# Patient Record
Sex: Female | Born: 1960 | Race: Black or African American | Hispanic: No | Marital: Married | Smoking: Never smoker
Health system: Southern US, Community
[De-identification: ages and names within clinical notes are randomized; demographics above are authoritative.]

## PROBLEM LIST (undated history)

## (undated) DIAGNOSIS — E785 Hyperlipidemia, unspecified: Secondary | ICD-10-CM

## (undated) DIAGNOSIS — I10 Essential (primary) hypertension: Secondary | ICD-10-CM

## (undated) DIAGNOSIS — I639 Cerebral infarction, unspecified: Secondary | ICD-10-CM

## (undated) HISTORY — DX: Cerebral infarction, unspecified: I63.9

## (undated) HISTORY — DX: Essential (primary) hypertension: I10

## (undated) HISTORY — DX: Hyperlipidemia, unspecified: E78.5

---

## 2004-11-16 ENCOUNTER — Emergency Department (HOSPITAL_COMMUNITY): Admission: EM | Admit: 2004-11-16 | Discharge: 2004-11-16 | Payer: Self-pay | Admitting: Emergency Medicine

## 2014-04-17 ENCOUNTER — Ambulatory Visit (INDEPENDENT_AMBULATORY_CARE_PROVIDER_SITE_OTHER): Payer: BLUE CROSS/BLUE SHIELD

## 2014-04-17 ENCOUNTER — Ambulatory Visit (INDEPENDENT_AMBULATORY_CARE_PROVIDER_SITE_OTHER): Payer: BLUE CROSS/BLUE SHIELD | Admitting: Physician Assistant

## 2014-04-17 VITALS — BP 160/92 | HR 92 | Temp 98.2°F | Resp 18 | Ht 64.5 in | Wt 235.6 lb

## 2014-04-17 DIAGNOSIS — M79671 Pain in right foot: Secondary | ICD-10-CM

## 2014-04-17 MED ORDER — DOXYCYCLINE HYCLATE 100 MG PO CAPS: 100.0000 mg | ORAL_CAPSULE | Freq: Two times a day (BID) | ORAL | Status: AC

## 2014-04-17 NOTE — Progress Notes (Deleted)
   Subjective:    Patient ID: Terri RilesRosalind E Dickerson, female    DOB: [birth year], 54 y.o.   MRN: [id number]  HPI    Review of Systems     Objective:   Physical Exam        Assessment & Plan:

## 2014-04-17 NOTE — Patient Instructions (Signed)
Elevate the foot at night. Take the doxycycline to completion.

## 2014-04-18 NOTE — Progress Notes (Signed)
[facility] [address] [place] [address] [phone]  Date:  04/17/2014   Name:  Terri Dickerson   DOB:  04/06/1960   MRN:  [id number]  PCP:  No primary care provider on file.    Chief Complaint: Blister   History of Present Illness:  Terri Dickerson is a 54 y.o. very pleasant female patient who presents with the following:  Patient notes 3 days of foot pain at the sole of the foot.  Pain is aggravated by weight and pressure on that right foot.  She thought she had a blister due to being on her feet all day, and walking more with commuting.  Patient has no known hx of diabetes, or htn.  She denies chest pains or palpitations.  There is swelling along her right foot, which she notes is normally not that swollen.  She denies loss of sensation of her lower extremities.  She denies calf pain, long sedentary positioning, long travel, or fever.  She has no trauma to the right foot, and has never had a significant hx of foot nor ankle injury. Patient has no hx of gout or pain at this site in the past.   There are no active problems to display for this patient.   History reviewed. No pertinent past medical history.  History reviewed. No pertinent past surgical history.  History  Substance Use Topics  . Smoking status: Never Smoker   . Smokeless tobacco: Not on file  . Alcohol Use: Not on file    Family History  Problem Relation Age of Onset  . Hypertension Mother   . Hyperlipidemia Mother     No Known Allergies  Medication list has been reviewed and updated.  No current outpatient prescriptions on file prior to visit.   No current facility-administered medications on file prior to visit.    Review of Systems: ROS otherwise unremarkable unless listed above.   Physical Examination: Filed Vitals:   04/17/14 1140  BP: 160/92  Pulse: 92  Temp: 98.2 F (36.8 C)  Resp: 18   Filed Vitals:   04/17/14 1140  Height: 5' 4.5" (1.638 m)   Weight: 235 lb 9.6 oz (106.867 kg)   Body mass index is 39.83 kg/(m^2). Ideal Body Weight: Weight in (lb) to have BMI = 25: 147.6  Physical Exam  Constitutional: She is oriented to person, place, and time. She appears well-developed and well-nourished. No distress.  Eyes: EOM are normal. Pupils are equal, round, and reactive to light.  Neck: No thyromegaly present.  Cardiovascular: Normal rate, regular rhythm and normal heart sounds.  Exam reveals no friction rub.   No murmur heard. Pulmonary/Chest: Effort normal and breath sounds normal. No respiratory distress.  Musculoskeletal: Normal range of motion.  Neurological: She is alert and oriented to person, place, and time.  Skin: Skin is warm and dry. She is not diaphoretic.  Red along the 1st metatarsal of the right foot.  Minimal fluctuance appreciated.  Tender just distal to first metatarsal.  Normal ROM.  1st MTP with dorsiflexion incites pain.  Normal strength throughout.  There is non-pitting edema throughout the right foot and digits.  Normal vibratory sensation at the 1st MTP without pain.  No swelling around the ankle/malleolus.  No tenderness along the calf  Psychiatric: She has a normal mood and affect. Her behavior is normal.     [facility] reading (PRIMARY) by  Dr. Neva Seat: Negative   Assessment and Plan: 54 year old  female without any known illness, is here today for right foot pain.  Diff dx: cellulitis, stress fracture, gout.  More suspicious of bacterial etiology. Treating with doxycycline Metarsal padding placed to decrease weight off pain site. Advised NSAID use and elevation rtc in 3 day  Right foot pain - Plan: DG Foot Complete Right, doxycycline (VIBRAMYCIN) 100 MG capsule  Plan reviewed with Dr. Lorenza CambridgeGreene Waddell Iten, PA-C [facility] 3/31/20164:24 PM

## 2015-05-04 ENCOUNTER — Encounter (HOSPITAL_COMMUNITY): Payer: Self-pay | Admitting: Emergency Medicine

## 2015-05-04 ENCOUNTER — Emergency Department (HOSPITAL_COMMUNITY): Payer: Self-pay

## 2015-05-04 ENCOUNTER — Emergency Department (HOSPITAL_COMMUNITY)
Admission: EM | Admit: 2015-05-04 | Discharge: 2015-05-04 | Disposition: A | Discharge status: Home / Self Care | Payer: Self-pay | Attending: Emergency Medicine | Admitting: Emergency Medicine

## 2015-05-04 DIAGNOSIS — M79602 Pain in left arm: Secondary | ICD-10-CM | POA: Insufficient documentation

## 2015-05-04 DIAGNOSIS — I10 Essential (primary) hypertension: Secondary | ICD-10-CM | POA: Insufficient documentation

## 2015-05-04 LAB — CBC WITH DIFFERENTIAL/PLATELET
Basophils Absolute: 0 10*3/uL (ref 0.0–0.1)
Basophils Relative: 0 %
EOS ABS: 0.2 10*3/uL (ref 0.0–0.7)
EOS PCT: 3 %
HCT: 38.8 % (ref 36.0–46.0)
Hemoglobin: 12.4 g/dL (ref 12.0–15.0)
LYMPHS ABS: 2.8 10*3/uL (ref 0.7–4.0)
LYMPHS PCT: 47 %
MCH: 26.6 pg (ref 26.0–34.0)
MCHC: 32 g/dL (ref 30.0–36.0)
MCV: 83.1 fL (ref 78.0–100.0)
MONO ABS: 0.4 10*3/uL (ref 0.1–1.0)
MONOS PCT: 6 %
Neutro Abs: 2.7 10*3/uL (ref 1.7–7.7)
Neutrophils Relative %: 44 %
PLATELETS: 170 10*3/uL (ref 150–400)
RBC: 4.67 MIL/uL (ref 3.87–5.11)
RDW: 13.1 % (ref 11.5–15.5)
WBC: 6 10*3/uL (ref 4.0–10.5)

## 2015-05-04 LAB — URINE MICROSCOPIC-ADD ON

## 2015-05-04 LAB — I-STAT TROPONIN, ED: Troponin i, poc: 0.01 ng/mL (ref 0.00–0.08)

## 2015-05-04 LAB — URINALYSIS, ROUTINE W REFLEX MICROSCOPIC
BILIRUBIN URINE: NEGATIVE
GLUCOSE, UA: NEGATIVE mg/dL
Ketones, ur: NEGATIVE mg/dL
Leukocytes, UA: NEGATIVE
NITRITE: NEGATIVE
PH: 7.5 (ref 5.0–8.0)
Protein, ur: NEGATIVE mg/dL
SPECIFIC GRAVITY, URINE: 1.012 (ref 1.005–1.030)

## 2015-05-04 LAB — BASIC METABOLIC PANEL
Anion gap: 10 (ref 5–15)
BUN: 8 mg/dL (ref 6–20)
CO2: 28 mmol/L (ref 22–32)
CREATININE: 0.58 mg/dL (ref 0.44–1.00)
Calcium: 9.2 mg/dL (ref 8.9–10.3)
Chloride: 100 mmol/L — ABNORMAL LOW (ref 101–111)
GFR calc Af Amer: >60 mL/min (ref >60)
GLUCOSE: 98 mg/dL (ref 65–99)
POTASSIUM: 3.6 mmol/L (ref 3.5–5.1)
SODIUM: 138 mmol/L (ref 135–145)

## 2015-05-04 MED ORDER — HYDROCODONE-ACETAMINOPHEN 5-325 MG PO TABS: 1.0000 | ORAL_TABLET | Freq: Four times a day (QID) | ORAL | Status: DC | PRN

## 2015-05-04 MED ORDER — AMLODIPINE BESYLATE 10 MG PO TABS: 10.0000 mg | ORAL_TABLET | Freq: Every day | ORAL | Status: DC

## 2015-05-04 MED ORDER — HYDRALAZINE HCL 20 MG/ML IJ SOLN
10.0000 mg | Freq: Once | INTRAMUSCULAR | Status: AC
  Administered 2015-05-04: 10 mg via INTRAVENOUS
  Filled 2015-05-04: qty 1

## 2015-05-04 MED ORDER — AMLODIPINE BESYLATE 5 MG PO TABS
10.0000 mg | ORAL_TABLET | Freq: Once | ORAL | Status: AC
  Administered 2015-05-04: 10 mg via ORAL
  Filled 2015-05-04: qty 2

## 2015-05-04 NOTE — ED Notes (Signed)
Dr. Elesa MassedWard notified on pt.'s elevated blood pressure .

## 2015-05-04 NOTE — ED Notes (Signed)
Pt taken to xray 

## 2015-05-04 NOTE — ED Notes (Addendum)
Pt. injured her left arm after lifting a pt. at work this evening reports pain at left elbow and left wrist . Hypertensive at arrival.

## 2015-05-04 NOTE — Discharge Instructions (Signed)
To find a primary care or specialty doctor please call [phone] or [phone] to access "[place] Find a Doctor Service."  You may also go on the [facility] website at [email or web address]  There are also multiple [facility] practices throughout the [place] that are frequently accepting new patients. You may find a clinic that is close to your home and contact them.  [facility] -  [address] [place] [address] [phone]  [facility] in [place] (also locations in [place] and [place]) -  [address] [place] [address] [phone]  [facility] -  [address] [place] [address] [phone]      DASH Eating Plan DASH stands for "Dietary Approaches to Stop Hypertension." The DASH eating plan is a healthy eating plan that has been shown to reduce high blood pressure (hypertension). Additional health benefits may include reducing the risk of type 2 diabetes mellitus, heart disease, and stroke. The DASH eating plan may also help with weight loss. WHAT DO I NEED TO KNOW ABOUT THE DASH EATING PLAN? For the DASH eating plan, you will follow these general guidelines:  Choose foods with a percent daily value for sodium of less than 5% (as listed on the food label).  Use salt-free seasonings or herbs instead of table salt or sea salt.  Check with your health care provider or pharmacist before using salt substitutes.  Eat lower-sodium products, often labeled as "lower sodium" or "no salt added."  Eat fresh foods.  Eat more vegetables, fruits, and low-fat dairy products.  Choose whole grains. Look for the word "whole" as the first word in the ingredient list.  Choose fish and skinless chicken or [place] more often than red meat. Limit fish, poultry, and meat to 6 oz (170 g) each day.  Limit sweets, desserts, sugars, and sugary  drinks.  Choose heart-healthy fats.  Limit cheese to 1 oz (28 g) per day.  Eat more home-cooked food and less restaurant, buffet, and fast food.  Limit fried foods.  Cook foods using methods other than frying.  Limit canned vegetables. If you do use them, rinse them well to decrease the sodium.  When eating at a restaurant, ask that your food be prepared with less salt, or no salt if possible. WHAT FOODS CAN I EAT? Seek help from a dietitian for individual calorie needs. Grains Whole grain or whole wheat bread. Brown rice. Whole grain or whole wheat pasta. Quinoa, bulgur, and whole grain cereals. Low-sodium cereals. Corn or whole wheat flour tortillas. Whole grain cornbread. Whole grain crackers. Low-sodium crackers. Vegetables Fresh or frozen vegetables (raw, steamed, roasted, or grilled). Low-sodium or reduced-sodium tomato and vegetable juices. Low-sodium or reduced-sodium tomato sauce and paste. Low-sodium or reduced-sodium canned vegetables.  Fruits All fresh, canned (in natural juice), or frozen fruits. Meat and Other Protein Products Ground beef (85% or leaner), grass-fed beef, or beef trimmed of fat. Skinless chicken or [place]. Ground chicken or [place]. Pork trimmed of fat. All fish and seafood. Eggs. Dried beans, peas, or lentils. Unsalted nuts and seeds. Unsalted canned beans. Dairy Low-fat dairy products, such as skim or 1% milk, 2% or reduced-fat cheeses, low-fat ricotta or cottage cheese, or plain low-fat yogurt. Low-sodium or reduced-sodium cheeses. Fats and Oils Tub margarines without trans fats. Light or reduced-fat mayonnaise and salad dressings (reduced sodium). Avocado. Safflower, olive, or canola oils. Natural peanut or almond butter. Other Unsalted popcorn and pretzels. The  items listed above may not be a complete list of recommended foods or beverages. Contact your dietitian for more options. WHAT FOODS ARE NOT RECOMMENDED? Grains White bread. White pasta.  White rice. Refined cornbread. Bagels and croissants. Crackers that contain trans fat. Vegetables Creamed or fried vegetables. Vegetables in a cheese sauce. Regular canned vegetables. Regular canned tomato sauce and paste. Regular tomato and vegetable juices. Fruits Dried fruits. Canned fruit in light or heavy syrup. Fruit juice. Meat and Other Protein Products Fatty cuts of meat. Ribs, chicken wings, bacon, sausage, bologna, salami, chitterlings, fatback, hot dogs, bratwurst, and packaged luncheon meats. Salted nuts and seeds. Canned beans with salt. Dairy Whole or 2% milk, cream, half-and-half, and cream cheese. Whole-fat or sweetened yogurt. Full-fat cheeses or blue cheese. Nondairy creamers and whipped toppings. Processed cheese, cheese spreads, or cheese curds. Condiments Onion and garlic salt, seasoned salt, table salt, and sea salt. Canned and packaged gravies. Worcestershire sauce. Tartar sauce. Barbecue sauce. Teriyaki sauce. Soy sauce, including reduced sodium. Steak sauce. Fish sauce. Oyster sauce. Cocktail sauce. Horseradish. Ketchup and mustard. Meat flavorings and tenderizers. Bouillon cubes. Hot sauce. Tabasco sauce. Marinades. Taco seasonings. Relishes. Fats and Oils Butter, stick margarine, lard, shortening, ghee, and bacon fat. Coconut, palm kernel, or palm oils. Regular salad dressings. Other Pickles and olives. Salted popcorn and pretzels. The items listed above may not be a complete list of foods and beverages to avoid. Contact your dietitian for more information. WHERE CAN I FIND MORE INFORMATION? National Heart, Lung, and Blood Institute: CablePromo.itwww.nhlbi.nih.gov/health/health-topics/topics/dash/   This information is not intended to replace advice given to you by your health care provider. Make sure you discuss any questions you have with your health care provider.   Document Released: 12/24/2010 Document Revised: 01/25/2014 Document Reviewed: 11/08/2012 Elsevier Interactive  Patient Education 2016 [employer]  Hypertension Hypertension, commonly called high blood pressure, is when the force of blood pumping through your arteries is too strong. Your arteries are the blood vessels that carry blood from your heart throughout your body. A blood pressure reading consists of a higher number over a lower number, such as 110/72. The higher number (systolic) is the pressure inside your arteries when your heart pumps. The lower number (diastolic) is the pressure inside your arteries when your heart relaxes. Ideally you want your blood pressure below 120/80. Hypertension forces your heart to work harder to pump blood. Your arteries may become narrow or stiff. Having untreated or uncontrolled hypertension can cause heart attack, stroke, kidney disease, and other problems. RISK FACTORS Some risk factors for high blood pressure are controllable. Others are not.  Risk factors you cannot control include:   Race. You may be at higher risk if you are African American.  Age. Risk increases with age.  Gender. Men are at higher risk than women before age 53 years. After age 53, women are at higher risk than men. Risk factors you can control include:  Not getting enough exercise or physical activity.  Being overweight.  Getting too much fat, sugar, calories, or salt in your diet.  Drinking too much alcohol. SIGNS AND SYMPTOMS Hypertension does not usually cause signs or symptoms. Extremely high blood pressure (hypertensive crisis) may cause headache, anxiety, shortness of breath, and nosebleed. DIAGNOSIS To check if you have hypertension, your health care provider will measure your blood pressure while you are seated, with your arm held at the level of your heart. It should be measured at least twice using the same arm. Certain conditions can  cause a difference in blood pressure between your right and left arms. A blood pressure reading that is higher than normal on one occasion  does not mean that you need treatment. If it is not clear whether you have high blood pressure, you may be asked to return on a different day to have your blood pressure checked again. Or, you may be asked to monitor your blood pressure at home for 1 or more weeks. TREATMENT Treating high blood pressure includes making lifestyle changes and possibly taking medicine. Living a healthy lifestyle can help lower high blood pressure. You may need to change some of your habits. Lifestyle changes may include:  Following the DASH diet. This diet is high in fruits, vegetables, and whole grains. It is low in salt, red meat, and added sugars.  Keep your sodium intake below 2,300 mg per day.  Getting at least 30-45 minutes of aerobic exercise at least 4 times per week.  Losing weight if necessary.  Not smoking.  Limiting alcoholic beverages.  Learning ways to reduce stress. Your health care provider may prescribe medicine if lifestyle changes are not enough to get your blood pressure under control, and if one of the following is true:  You are 51-48 years of age and your systolic blood pressure is above 140.  You are 43 years of age or older, and your systolic blood pressure is above 150.  Your diastolic blood pressure is above 90.  You have diabetes, and your systolic blood pressure is over 140 or your diastolic blood pressure is over 90.  You have kidney disease and your blood pressure is above 140/90.  You have heart disease and your blood pressure is above 140/90. Your personal target blood pressure may vary depending on your medical conditions, your age, and other factors. HOME CARE INSTRUCTIONS  Have your blood pressure rechecked as directed by your health care provider.   Take medicines only as directed by your health care provider. Follow the directions carefully. Blood pressure medicines must be taken as prescribed. The medicine does not work as well when you skip doses. Skipping  doses also puts you at risk for problems.  Do not smoke.   Monitor your blood pressure at home as directed by your health care provider. SEEK MEDICAL CARE IF:   You think you are having a reaction to medicines taken.  You have recurrent headaches or feel dizzy.  You have swelling in your ankles.  You have trouble with your vision. SEEK IMMEDIATE MEDICAL CARE IF:  You develop a severe headache or confusion.  You have unusual weakness, numbness, or feel faint.  You have severe chest or abdominal pain.  You vomit repeatedly.  You have trouble breathing. MAKE SURE YOU:   Understand these instructions.  Will watch your condition.  Will get help right away if you are not doing well or get worse.   This information is not intended to replace advice given to you by your health care provider. Make sure you discuss any questions you have with your health care provider.   Document Released: 01/04/2005 Document Revised: 05/21/2014 Document Reviewed: 10/27/2012 Elsevier Interactive Patient Education 2016 [employer].  How to Take Your Blood Pressure HOW DO I GET A BLOOD PRESSURE MACHINE?  You can buy an electronic home blood pressure machine at your local pharmacy. Insurance will sometimes cover the cost if you have a prescription.  Ask your doctor what type of machine is best for you. There are different machines  for your arm and your wrist.  If you decide to buy a machine to check your blood pressure on your arm, first check the size of your arm so you can buy the right size cuff. To check the size of your arm:   Use a measuring tape that shows both inches and centimeters.   Wrap the measuring tape around the upper-middle part of your arm. You may need someone to help you measure.   Write down your arm measurement in both inches and centimeters.   To measure your blood pressure correctly, it is important to have the right size cuff.   If your arm is up to 13  inches (up to 34 centimeters), get an adult cuff size.  If your arm is 13 to 17 inches (35 to 44 centimeters), get a large adult cuff size.    If your arm is 17 to 20 inches (45 to 52 centimeters), get an adult thigh cuff.  WHAT DO THE NUMBERS MEAN?   There are two numbers that make up your blood pressure. For example: 120/80.  The first number (120 in our example) is called the "systolic pressure." It is a measure of the pressure in your blood vessels when your heart is pumping blood.  The second number (80 in our example) is called the "diastolic pressure." It is a measure of the pressure in your blood vessels when your heart is resting between beats.  Your doctor will tell you what your blood pressure should be. WHAT SHOULD I DO BEFORE I CHECK MY BLOOD PRESSURE?   Try to rest or relax for at least 30 minutes before you check your blood pressure.  Do not smoke.  Do not have any drinks with caffeine, such as:  Soda.  Coffee.  Tea.  Check your blood pressure in a quiet room.  Sit down and stretch out your arm on a table. Keep your arm at about the level of your heart. Let your arm relax.  Make sure that your legs are not crossed. HOW DO I CHECK MY BLOOD PRESSURE?  Follow the directions that came with your machine.  Make sure you remove any tight-fitting clothing from your arm or wrist. Wrap the cuff around your upper arm or wrist. You should be able to fit a finger between the cuff and your arm. If you cannot fit a finger between the cuff and your arm, it is too tight and should be removed and rewrapped.  Some units require you to manually pump up the arm cuff.  Automatic units inflate the cuff when you press a button.  Cuff deflation is automatic in both models.  After the cuff is inflated, the unit measures your blood pressure and pulse. The readings are shown on a monitor. Hold still and breathe normally while the cuff is inflated.  Getting a reading takes less  than a minute.  Some models store readings in a memory. Some provide a printout of readings. If your machine does not store your readings, keep a written record.  Take readings with you to your next visit with your doctor.   This information is not intended to replace advice given to you by your health care provider. Make sure you discuss any questions you have with your health care provider.   Document Released: 12/18/2007 Document Revised: 01/25/2014 Document Reviewed: 03/01/2013 Elsevier Interactive Patient Education [employer]

## 2015-05-04 NOTE — ED Provider Notes (Signed)
By signing my name below, I, Linus Galas, attest that this documentation has been prepared under the direction and in the presence of [employer], DO. Electronically Signed: Linus Galas, ED Scribe. 05/04/2015. 1:49 AM.  TIME SEEN: 1:41 AM  CHIEF COMPLAINT:  Chief Complaint  Patient presents with  . Arm Pain    HPI:  HPI Comments: Terri Dickerson is a 55 y.o. female who presents to the Emergency Department with no PMHx complaining of L upper arm pain that began At 8:30 PM. Pt is a CNA and picks up patients but denies any Injury that she is aware of. However, she states she has not been sleeping well lately due to working the 3rd shift. Pt is here because her arm continues to her and she was concerned that this could be cardiac in nature. Pt denies any chest pain or chest discomfort, SOB, N/V, diaphoresis or dizziness. She is very hypertensive. States she is not on blood pressure medication. Has not seen a primary care physician in many years. States she is not sure what her blood pressure normally runs. Denies headache, vision changes, numbness, tingling or weakness on one side of her body. No history of stress test or cardiac catheterization.  Pt is not on any medication. Pt does not see a PCP Pt does not smoke  ROS: See HPI Constitutional: no fever  Eyes: no drainage  ENT: no runny nose   Cardiovascular:  no chest pain  Resp: no SOB  GI: no vomiting GU: no dysuria Integumentary: no rash  Allergy: no hives  Musculoskeletal: no leg swelling  Neurological: no slurred speech ROS otherwise negative  PAST MEDICAL HISTORY/PAST SURGICAL HISTORY:  History reviewed. No pertinent past medical history.  MEDICATIONS:  Prior to Admission medications   Not on File    ALLERGIES:  No Known Allergies  SOCIAL HISTORY:  Social History  Substance Use Topics  . Smoking status: Never Smoker   . Smokeless tobacco: Not on file  . Alcohol Use: 0.0 oz/week    0 Standard drinks or  equivalent per week    FAMILY HISTORY: Family History  Problem Relation Age of Onset  . Hypertension Mother   . Hyperlipidemia Mother     EXAM: BP 201/111 mmHg  Pulse 98  Temp(Src) 97.9 F (36.6 C) (Oral)  Resp 18  Ht  (1.626 m)  Wt 239 lb 3 oz (108.495 kg)  BMI 41.04 kg/m2  SpO2 99%  LMP  (LMP Unknown)   CONSTITUTIONAL: Alert and oriented and responds appropriately to questions. Well-appearing; well-nourished HEAD: Normocephalic EYES: Conjunctivae clear, PERRL ENT: normal nose; no rhinorrhea; moist mucous membranes NECK: Supple, no meningismus, no LAD  CARD: RRR; S1 and S2 appreciated; no murmurs, no clicks, no rubs, no gallops RESP: Normal chest excursion without splinting or tachypnea; breath sounds clear and equal bilaterally; no wheezes, no rhonchi, no rales, no hypoxia or respiratory distress, speaking full sentences ABD/GI: Normal bowel sounds; non-distended; soft, non-tender, no rebound, no guarding, no peritoneal signs BACK:  The back appears normal and is non-tender to palpation, there is no CVA tenderness EXT: Normal ROM in all joints; non-tender to palpation; no edema; normal capillary refill; no cyanosis, no calf tenderness or swelling , left arm is nontender to palpation, no bony deformity, no erythema, no warmth, soft compartments, no joint effusion, 2+ left-sided radial pulse, normal grip strength   SKIN: Normal color for age and race; warm; no rash NEURO: Moves all extremities equally, sensation to light touch  intact diffusely, cranial nerves II through XII intact PSYCH: The patient's mood and manner are appropriate. Grooming and personal hygiene are appropriate.  MEDICAL DECISION MAKING: Patient here with left-sided arm pain. She denies any known injury. There is no bony deformity or tenderness to palpation of the arm. No sign of septic arthritis, gout. Neurovascularly intact distally. She is extremely hypertensive in the emergency department. I'm concerned  that this could be her anginal equivalent. EKG shows no ischemic abnormality and she has no old for comparison. We'll give IV hydralazine to see if this improves her blood pressure in her left arm pain. We'll obtain labs, chest x-ray. At this time she does not want admission to the hospital.  ED PROGRESS: Patient's blood pressure has improved. Her pain is also almost completely gone. Troponin obtained 6 hours after the onset of symptoms is negative. Her urine shows trace blood but no protein. Normal creatinine. Chest x-ray is clear. I have discussed with her at length that I recommend close outpatient follow-up for her uncontrolled blood pressure. I do feel she is safe to be discharged and she refuses any further workup in the emergency department and would not want admission. We'll discharge with PCP follow-up information and start her on amlodipine 10 mg once a day. Have discussed at length return precautions. She verbalized understanding and is comfortable with this plan.   At this time, I do not feel there is any life-threatening condition present. I have reviewed and discussed all results (EKG, imaging, lab, urine as appropriate), exam findings with patient. I have reviewed nursing notes and appropriate previous records.  I feel the patient is safe to be discharged home without further emergent workup. Discussed usual and customary return precautions. Patient and family (if present) verbalize understanding and are comfortable with this plan.  Patient will follow-up with their primary care provider. If they do not have a primary care provider, information for follow-up has been provided to them. All questions have been answered.      EKG Interpretation  Date/Time:  Sunday May 04 2015 00:33:09 EDT Ventricular Rate:  78 PR Interval:  158 QRS Duration: 76 QT Interval:  388 QTC Calculation: 442 R Axis:   8 Text Interpretation:  Normal sinus rhythm Normal ECG No old tracing to compare Confirmed  by Aviel Davalos,  DO, Jesusita Jocelyn ([id number]) on 05/04/2015 1:23:27 AM       I personally performed the services described in this documentation, which was scribed in my presence. The recorded information has been reviewed and is accurate.    Layla MawKristen N Lakeith Careaga, DO 05/04/15 [phone]

## 2016-05-13 ENCOUNTER — Encounter: Payer: Self-pay | Admitting: Family Medicine

## 2016-05-13 ENCOUNTER — Ambulatory Visit (INDEPENDENT_AMBULATORY_CARE_PROVIDER_SITE_OTHER): Payer: Managed Care, Other (non HMO) | Admitting: Family Medicine

## 2016-05-13 VITALS — BP 184/92 | HR 80 | Temp 98.2°F | Resp 18 | Ht 63.58 in | Wt 216.6 lb

## 2016-05-13 DIAGNOSIS — Z5181 Encounter for therapeutic drug level monitoring: Secondary | ICD-10-CM | POA: Diagnosis not present

## 2016-05-13 DIAGNOSIS — R739 Hyperglycemia, unspecified: Secondary | ICD-10-CM | POA: Diagnosis not present

## 2016-05-13 DIAGNOSIS — I10 Essential (primary) hypertension: Secondary | ICD-10-CM | POA: Diagnosis not present

## 2016-05-13 DIAGNOSIS — R0681 Apnea, not elsewhere classified: Secondary | ICD-10-CM

## 2016-05-13 DIAGNOSIS — I638 Other cerebral infarction: Secondary | ICD-10-CM

## 2016-05-13 DIAGNOSIS — I6389 Other cerebral infarction: Secondary | ICD-10-CM

## 2016-05-13 MED ORDER — HYDROCHLOROTHIAZIDE 25 MG PO TABS: 25.0000 mg | ORAL_TABLET | Freq: Every day | ORAL | 0 refills | Status: DC

## 2016-05-13 MED ORDER — LISINOPRIL 10 MG PO TABS: 20.0000 mg | ORAL_TABLET | Freq: Every day | ORAL | Status: DC

## 2016-05-13 NOTE — Patient Instructions (Addendum)
IF you received an x-ray today, you will receive an invoice from [facility]. Please contact [facility] at [phone] with questions or concerns regarding your invoice.   IF you received labwork today, you will receive an invoice from [place]. Please contact [facility] at [phone] with questions or concerns regarding your invoice.   Our billing staff will not be able to assist you with questions regarding bills from these companies.  You will be contacted with the lab results as soon as they are available. The fastest way to get your results is to activate your My Chart account. Instructions are located on the last page of this paperwork. If you have not heard from [place] regarding the results in 2 weeks, please contact this office.     ]Managing Your Hypertension Hypertension is commonly called high blood pressure. This is when the force of your blood pressing against the walls of your arteries is too strong. Arteries are blood vessels that carry blood from your heart throughout your body. Hypertension forces the heart to work harder to pump blood, and may cause the arteries to become narrow or stiff. Having untreated or uncontrolled hypertension can cause heart attack, stroke, kidney disease, and other problems. What are blood pressure readings? A blood pressure reading consists of a higher number over a lower number. Ideally, your blood pressure should be below 120/80. The first ("top") number is called the systolic pressure. It is a measure of the pressure in your arteries as your heart beats. The second ("bottom") number is called the diastolic pressure. It is a measure of the pressure in your arteries as the heart relaxes. What does my blood pressure reading mean? Blood pressure is classified into four stages. Based on your blood pressure reading, your health care provider may use the following stages to determine what type of treatment you need, if any. Systolic  pressure and diastolic pressure are measured in a unit called mm Hg. Normal   Systolic pressure: below 120.  Diastolic pressure: below 80. Elevated   Systolic pressure: 120-129.  Diastolic pressure: below 80. Hypertension stage 1     Diastolic pressure: 80-89. Hypertension stage 2   Systolic pressure: 140 or above.  Diastolic pressure: 90 or above. What health risks are associated with hypertension? Managing your hypertension is an important responsibility. Uncontrolled hypertension can lead to:  A heart attack.  A stroke.  A weakened blood vessel (aneurysm).  Heart failure.  Kidney damage.  Eye damage.  Metabolic syndrome.  Memory and concentration problems. What changes can I make to manage my hypertension? Eating and drinking   Eat a diet that is high in fiber and potassium, and low in salt (sodium), added sugar, and fat. An example eating plan is called the DASH (Dietary Approaches to Stop Hypertension) diet. To eat this way:  Eat plenty of fresh fruits and vegetables. Try to fill half of your plate at each meal with fruits and vegetables.  Eat whole grains, such as whole wheat pasta, brown rice, or whole grain bread. Fill about one quarter of your plate with whole grains.  Eat low-fat diary products.  Avoid fatty cuts of meat, processed or cured meats, and poultry with skin. Fill about one quarter of your plate with lean proteins such as fish, chicken without skin, beans, eggs, and tofu.  Avoid premade and processed foods. These tend to be higher in sodium, added sugar, and fat.     Lifestyle   Work with your health care  provider to maintain a healthy body weight, or to lose weight. Ask what an ideal weight is for you.  Get at least 30 minutes of exercise that causes your heart to beat faster (aerobic exercise) most days of the week. Activities may include walking, swimming, or biking.       Monitoring   Monitor your blood pressure at home as  told by your health care provider. Your personal target blood pressure may vary depending on your medical conditions, your age, and other factors.  Have your blood pressure checked regularly, as often as told by your health care provider. Working with your health care provider   Review all the medicines you take with your health care provider because there may be side effects or interactions.  Talk with your health care provider about your diet, exercise habits, and other lifestyle factors that may be contributing to hypertension.  Visit your health care provider regularly. Your health care provider can help you create and adjust your plan for managing hypertension. Will I need medicine to control my blood pressure? Your health care provider may prescribe medicine if lifestyle changes are not enough to get your blood pressure under control, and if:  Your systolic blood pressure is 130 or higher.  Your diastolic blood pressure is 80 or higher. Take medicines only as told by your health care provider. Follow the directions carefully. Blood pressure medicines must be taken as prescribed. The medicine does not work as well when you skip doses. Skipping doses also puts you at risk for problems. Contact a health care provider if:  You think you are having a reaction to medicines you have taken.  You have repeated (recurrent) headaches.  You feel dizzy.  You have swelling in your ankles.  You have trouble with your vision. Get help right away if:  You develop a severe headache or confusion.  You have unusual weakness or numbness, or you feel faint.  You have severe pain in your chest or abdomen.  You vomit repeatedly.  You have trouble breathing. Summary  Hypertension is when the force of blood pumping through your arteries is too strong. If this condition is not controlled, it may put you at risk for serious complications.  Your personal target blood pressure may vary depending  on your medical conditions, your age, and other factors. For most people, a normal blood pressure is less than 120/80.  Hypertension is managed by lifestyle changes, medicines, or both. Lifestyle changes include weight loss, eating a healthy, low-sodium diet, exercising more, and limiting alcohol. This information is not intended to replace advice given to you by your health care provider. Make sure you discuss any questions you have with your health care provider. Document Released: 09/29/2011 Document Revised: 12/03/2015 Document Reviewed: 12/03/2015 Elsevier Interactive Patient Education  2017 Else[employer] Inc.  Potassium Content of Foods Potassium is a mineral found in many foods and drinks. It helps keep fluids and minerals balanced in your body and affects how steadily your heart beats. Potassium also helps control your blood pressure and keep your muscles and nervous system healthy. Certain health conditions and medicines may change the balance of potassium in your body. When this happens, you can help balance your level of potassium through the foods that you do or do not eat. Your health care provider or dietitian may recommend an amount of potassium that you should have each day. The following lists of foods provide the amount of potassium (in parentheses) per serving in each  item. High in potassium The following foods and beverages have 200 mg or more of potassium per serving:  Apricots, 2 raw or 5 dry (200 mg).  Artichoke, 1 medium (345 mg).  Avocado, raw,  each (245 mg).  Banana, 1 medium (425 mg).  Beans, lima, or baked beans, canned,  cup (280 mg).  Beans, white, canned,  cup (595 mg).  Beef roast, 3 oz (320 mg).  Beef, ground, 3 oz (270 mg).  Beets, raw or cooked,  cup (260 mg).  Bran muffin, 2 oz (300 mg).  Broccoli,  cup (230 mg).  Brussels sprouts,  cup (250 mg).  Cantaloupe,  cup (215 mg).  Cereal, 100% bran,  cup (200-400 mg).  Cheeseburger, single,  fast food, 1 each (225-400 mg).  Chicken, 3 oz (220 mg).  Clams, canned, 3 oz (535 mg).  Crab, 3 oz (225 mg).  Dates, 5 each (270 mg).  Dried beans and peas,  cup (300-475 mg).  Figs, dried, 2 each (260 mg).  Fish: halibut, tuna, cod, snapper, 3 oz (480 mg).  Fish: salmon, haddock, swordfish, perch, 3 oz (300 mg).  Fish, tuna, canned 3 oz (200 mg).  [place] fries, fast food, 3 oz (470 mg).  Granola with fruit and nuts,  cup (200 mg).  Grapefruit juice,  cup (200 mg).  Greens, beet,  cup (655 mg).  Honeydew melon,  cup (200 mg).  Kale, raw, 1 cup (300 mg).  Kiwi, 1 medium (240 mg).  Kohlrabi, rutabaga, parsnips,  cup (280 mg).  Lentils,  cup (365 mg).  Mango, 1 each (325 mg).  Milk, chocolate, 1 cup (420 mg).  Milk: nonfat, low-fat, whole, buttermilk, 1 cup (350-380 mg).  Molasses, 1 Tbsp (295 mg).  Mushrooms,  cup (280) mg.  Nectarine, 1 each (275 mg).  Nuts: almonds, peanuts, hazelnuts, [place], cashew, mixed, 1 oz (200 mg).  Nuts, pistachios, 1 oz (295 mg).  Orange, 1 each (240 mg).  Orange juice,  cup (235 mg).  Papaya, medium,  fruit (390 mg).  Peanut butter, chunky, 2 Tbsp (240 mg).  Peanut butter, smooth, 2 Tbsp (210 mg).  Pear, 1 medium (200 mg).  Pomegranate, 1 whole (400 mg).  Pomegranate juice,  cup (215 mg).  Pork, 3 oz (350 mg).  Potato chips, salted, 1 oz (465 mg).  Potato, baked with skin, 1 medium (925 mg).  Potatoes, boiled,  cup (255 mg).  Potatoes, mashed,  cup (330 mg).  Prune juice,  cup (370 mg).  Prunes, 5 each (305 mg).  Pudding, chocolate,  cup (230 mg).  Pumpkin, canned,  cup (250 mg).  Raisins, seedless,  cup (270 mg).  Seeds, sunflower or pumpkin, 1 oz (240 mg).  Soy milk, 1 cup (300 mg).  Spinach,  cup (420 mg).  Spinach, canned,  cup (370 mg).  Sweet potato, baked with skin, 1 medium (450 mg).  Swiss chard,  cup (480 mg).  Tomato or vegetable juice,  cup (275  mg).  Tomato sauce or puree,  cup (400-550 mg).  Tomato, raw, 1 medium (290 mg).  Tomatoes, canned,  cup (200-300 mg).  [place], 3 oz (250 mg).  Wheat germ, 1 oz (250 mg).  Winter squash,  cup (250 mg).  Yogurt, plain or fruited, 6 oz (260-435 mg).  Zucchini,  cup (220 mg). Moderate in potassium The following foods and beverages have 50-200 mg of potassium per serving:  Apple, 1 each (150 mg).  Apple juice,  cup (150 mg).  Applesauce,  cup (  90 mg).  Apricot nectar,  cup (140 mg).  Asparagus, small spears,  cup or 6 spears (155 mg).  Bagel, cinnamon raisin, 1 each (130 mg).  Bagel, egg or plain, 4 in., 1 each (70 mg).  Beans, green,  cup (90 mg).  Beans, yellow,  cup (190 mg).  Beer, regular, 12 oz (100 mg).  Beets, canned,  cup (125 mg).  Blackberries,  cup (115 mg).  Blueberries,  cup (60 mg).  Bread, whole wheat, 1 slice (70 mg).  Broccoli, raw,  cup (145 mg).  Cabbage,  cup (150 mg).  Carrots, cooked or raw,  cup (180 mg).  Cauliflower, raw,  cup (150 mg).  Celery, raw,  cup (155 mg).  Cereal, bran flakes, cup (120-150 mg).  Cheese, cottage,  cup (110 mg).  Cherries, 10 each (150 mg).  Chocolate, 1 oz bar (165 mg).  Coffee, brewed 6 oz (90 mg).  Corn,  cup or 1 ear (195 mg).  Cucumbers,  cup (80 mg).  Egg, large, 1 each (60 mg).  Eggplant,  cup (60 mg).  Endive, raw, cup (80 mg).  English muffin, 1 each (65 mg).  Fish, orange roughy, 3 oz (150 mg).  Frankfurter, beef or pork, 1 each (75 mg).  Fruit cocktail,  cup (115 mg).  Grape juice,  cup (170 mg).  Grapefruit,  fruit (175 mg).  Grapes,  cup (155 mg).  Greens: kale, turnip, collard,  cup (110-150 mg).  Ice cream or frozen yogurt, chocolate,  cup (175 mg).  Ice cream or frozen yogurt, vanilla,  cup (120-150 mg).  Lemons, limes, 1 each (80 mg).  Lettuce, all types, 1 cup (100 mg).  Mixed vegetables,  cup (150 mg).  Mushrooms,  raw,  cup (110 mg).  Nuts: walnuts, pecans, or macadamia, 1 oz (125 mg).  Oatmeal,  cup (80 mg).  Okra,  cup (110 mg).  Onions, raw,  cup (120 mg).  Peach, 1 each (185 mg).  Peaches, canned,  cup (120 mg).  Pears, canned,  cup (120 mg).  Peas, green, frozen,  cup (90 mg).  Peppers, green,  cup (130 mg).  Peppers, red,  cup (160 mg).  Pineapple juice,  cup (165 mg).  Pineapple, fresh or canned,  cup (100 mg).  Plums, 1 each (105 mg).  Pudding, vanilla,  cup (150 mg).  Raspberries,  cup (90 mg).  Rhubarb,  cup (115 mg).  Rice, wild,  cup (80 mg).  Shrimp, 3 oz (155 mg).  Spinach, raw, 1 cup (170 mg).  Strawberries,  cup (125 mg).  Summer squash  cup (175-200 mg).  Swiss chard, raw, 1 cup (135 mg).  Tangerines, 1 each (140 mg).  Tea, brewed, 6 oz (65 mg).  Turnips,  cup (140 mg).  Watermelon,  cup (85 mg).  Wine, red, table, 5 oz (180 mg).  Wine, white, table, 5 oz (100 mg). Low in potassium The following foods and beverages have less than 50 mg of potassium per serving.  Bread, white, 1 slice (30 mg).  Carbonated beverages, 12 oz (less than 5 mg).  Cheese, 1 oz (20-30 mg).  Cranberries,  cup (45 mg).  Cranberry juice cocktail,  cup (20 mg).  Fats and oils, 1 Tbsp (less than 5 mg).  Hummus, 1 Tbsp (32 mg).  Nectar: papaya, mango, or pear,  cup (35 mg).  Rice, white or brown,  cup (50 mg).  Spaghetti or macaroni,  cup cooked (30 mg).  Tortilla, flour or corn, 1 each (  50 mg).  Waffle, 4 in., 1 each (50 mg).  Water chestnuts,  cup (40 mg). This information is not intended to replace advice given to you by your health care provider. Make sure you discuss any questions you have with your health care provider. Document Released: 08/18/2004 Document Revised: 06/12/2015 Document Reviewed: 12/01/2012 Elsevier Interactive Patient Education  2017 [employer].

## 2016-05-13 NOTE — Progress Notes (Signed)
Subjective:    Patient ID: Terri Dickerson, female    DOB: [birth year], 56 y.o.   MRN: [id number]  HPI  Terri Dickerson is a 56 yo who is here today to establish care. She requested a full physical However we have had to defer this as she is a new patient to me and my clinic and she reports having a stroke last week which we have no records. We do see that she has an appointment for OT and PT early next week  She had a CVA in [place] when she was there for a funeral 9d prior.  She is here to establish care todaty BP initially was similar to today 184/88 and at discharge was improved at 142/90  Mainly about 140s-150s with occ spike to 170s in the evening and 120s his morning. Pulse stays about 60-80 All of her medicines are new, she knows she should have been on medications but did not have ins so was not.  Is not fasting.  Occ nausea and dizziness but improving.   CVA presented with diaphroesis, and dizziness (not vertigo).  We have no records other than her personal d/c summary which has no labs nor indication of evaluation.  CT and MRI - pt thinks of  They tried to have her drink contrast for nausea initially w/u (attempted to do CT abd/pelvis to w/u)  She had an echocardiogram.  She is unsure about her carotids. She was on telemetry but no stress test.   In hosp Tues - Sat, home Sun.  No edema. Wearing compression socks. Review of Systems     Objective:   Physical Exam       BP (!) 184/92   Pulse 80   Temp 98.2 F (36.8 C) (Oral)   Resp 18   Ht 5' 3.58" (1.615 m)   Wt 216 lb 9.6 oz (98.2 kg)   LMP  (LMP Unknown)   SpO2 95%   BMI 37.67 kg/m   [facility] reading (PRIMARY) by  Dr. Clelia Croft. EKG:  Assessment & Plan:   1. Cerebrovascular accident (CVA) due to other mechanism (HCC)   2. Essential hypertension   3. Elevated blood sugar   4. Medication monitoring encounter   5. Malignant hypertension   6. Apnea spell     Orders Placed This Encounter  Procedures  .  Hemoglobin A1c  . Comprehensive metabolic panel  . Ambulatory referral to Neurology    Referral Priority:   Routine    Referral Type:   Consultation    Referral Reason:   Specialty Services Required    Requested Specialty:   Neurology    Number of Visits Requested:   1  . Ambulatory referral to Sleep Studies    Referral Priority:   Routine    Referral Type:   Consultation    Referral Reason:   Specialty Services Required    Number of Visits Requested:   1  . EKG 12-Lead    Meds ordered this encounter  Medications  . aspirin 81 MG chewable tablet    Sig: Chew by mouth daily.  . carvedilol (COREG) 12.5 MG tablet    Sig: Take 12.5 mg by mouth 2 (two) times daily with a meal.  . DISCONTD: lisinopril (PRINIVIL,ZESTRIL) 10 MG tablet    Sig: Take 10 mg by mouth daily.  Marland Kitchen atorvastatin (LIPITOR) 80 MG tablet    Sig: Take 80 mg by mouth daily.  . ondansetron (ZOFRAN-ODT) 4 MG disintegrating tablet  Sig: Take 4 mg by mouth every 8 (eight) hours as needed for nausea or vomiting.  Marland Kitchen lisinopril (PRINIVIL,ZESTRIL) 10 MG tablet    Sig: Take 2 tablets (20 mg total) by mouth daily.  . hydrochlorothiazide (HYDRODIURIL) 25 MG tablet    Sig: Take 1 tablet (25 mg total) by mouth daily.    Dispense:  30 tablet    Refill:  0  . potassium chloride SA (K-DUR,KLOR-CON) 20 MEQ tablet    Sig: Take 1 tablet (20 mEq total) by mouth daily.    Dispense:  7 tablet    Refill:  3    Terri Dickerson, M.D.  Primary Care at [facility] [address] [place], [place] [address] [phone] phone [phone] fax  05/14/16 11:30 AM

## 2016-05-14 LAB — COMPREHENSIVE METABOLIC PANEL
A/G RATIO: 1 — AB (ref 1.2–2.2)
ALBUMIN: 3.9 g/dL (ref 3.5–5.5)
ALT: 16 IU/L (ref 0–32)
AST: 19 IU/L (ref 0–40)
Alkaline Phosphatase: 92 IU/L (ref 39–117)
BUN / CREAT RATIO: 20 (ref 9–23)
BUN: 12 mg/dL (ref 6–24)
Bilirubin Total: 0.5 mg/dL (ref 0.0–1.2)
CO2: 31 mmol/L — ABNORMAL HIGH (ref 18–29)
CREATININE: 0.61 mg/dL (ref 0.57–1.00)
Calcium: 9.7 mg/dL (ref 8.7–10.2)
Chloride: 93 mmol/L — ABNORMAL LOW (ref 96–106)
GFR calc Af Amer: 120 mL/min/{1.73_m2} (ref >59)
GFR, EST NON AFRICAN AMERICAN: 104 mL/min/{1.73_m2} (ref >59)
GLOBULIN, TOTAL: 4 g/dL (ref 1.5–4.5)
Glucose: 115 mg/dL — ABNORMAL HIGH (ref 65–99)
POTASSIUM: 3.2 mmol/L — AB (ref 3.5–5.2)
SODIUM: 141 mmol/L (ref 134–144)
Total Protein: 7.9 g/dL (ref 6.0–8.5)

## 2016-05-14 LAB — HEMOGLOBIN A1C
Est. average glucose Bld gHb Est-mCnc: 120 mg/dL
HEMOGLOBIN A1C: 5.8 % — AB (ref 4.8–5.6)

## 2016-05-14 MED ORDER — POTASSIUM CHLORIDE CRYS ER 20 MEQ PO TBCR: 20.0000 meq | EXTENDED_RELEASE_TABLET | Freq: Every day | ORAL | 3 refills | Status: DC

## 2016-05-17 ENCOUNTER — Ambulatory Visit: Payer: 59 | Attending: Psychiatry | Admitting: Rehabilitative and Restorative Service Providers"

## 2016-05-17 ENCOUNTER — Encounter: Payer: Self-pay | Admitting: Occupational Therapy

## 2016-05-17 ENCOUNTER — Telehealth: Payer: Self-pay | Admitting: Family Medicine

## 2016-05-17 ENCOUNTER — Ambulatory Visit: Payer: 59 | Admitting: Occupational Therapy

## 2016-05-17 DIAGNOSIS — R278 Other lack of coordination: Secondary | ICD-10-CM | POA: Diagnosis present

## 2016-05-17 DIAGNOSIS — R270 Ataxia, unspecified: Secondary | ICD-10-CM

## 2016-05-17 DIAGNOSIS — R2689 Other abnormalities of gait and mobility: Secondary | ICD-10-CM | POA: Diagnosis present

## 2016-05-17 DIAGNOSIS — R2681 Unsteadiness on feet: Secondary | ICD-10-CM | POA: Insufficient documentation

## 2016-05-17 DIAGNOSIS — R41842 Visuospatial deficit: Secondary | ICD-10-CM | POA: Diagnosis present

## 2016-05-17 NOTE — Therapy (Signed)
[facility] [address] [place], [place], [address] Phone: [phone]   Fax:  [phone]  Physical Therapy Evaluation  Patient Details  Name: Terri Dickerson MRN: [id number] Date of Birth: 1960-02-07 Referring Provider: [name], MD  Encounter Date: 05/17/2016      PT End of Session - 05/17/16 1502    Visit Number 1   Number of Visits 12   Date for PT Re-Evaluation 07/16/16   Authorization Type private insurance   PT Start Time [phone]   PT Stop Time 1017   PT Time Calculation (min) 40 min   Equipment Utilized During Treatment Gait belt   Activity Tolerance Patient tolerated treatment well   Behavior During Therapy [facility] for tasks assessed/performed      Past Medical History:  Diagnosis Date  . Hyperlipidemia   . Hypertension   . Stroke [facility])     No past surgical history on file.  There were no vitals filed for this visit.       Subjective Assessment - 05/17/16 0942    Subjective The patient is s/p ischemic stroke on 05/04/16 with acute hospital stay at [facility] ( until SAturday 05/08/16).  She returned to [place].  She c/o sensation of nausea, having some dizziness (not room moving, but in her head), imbalance, notes her L side is a little weak.     Pertinent History BP 121/70 this morning   Patient Stated Goals Get back on center and get close to 100% if I can.  "Get back to work", "Get back to me".   Currently in Pain? No/denies  occasional headaches            [facility] PT Assessment - 05/17/16 0944      Assessment   Medical Diagnosis acute ischemic stroke   Referring Provider [name], MD   Onset Date/Surgical Date 05/04/16   Hand Dominance Right   Prior Therapy acute at [facility]     Precautions   Precautions Fall     Restrictions   Weight Bearing Restrictions No     Home Environment   Living Environment Private residence   Living Arrangements Parent   Type of Home House  duplex   Home  Access Stairs to enter   Entrance Stairs-Number of Steps 6-7   Entrance Stairs-Rails Cannot reach both   Home Layout One level   Home Equipment Walker - 2 wheels;Shower seat;Bedside commode  elevated commode seat     Prior Function   Level of Independence Independent   Vocation Full time employment   Insurance account manager- at [employer]   Leisure takes care of grandkids     Cognition   Overall Cognitive Status Within Functional Limits for tasks assessed     Observation/Other Assessments   Focus on Therapeutic Outcomes (FOTO)  50%   Other Surveys  Other Surveys   Neuro Quality of Life  34.3%     Sensation   Light Touch Appears Intact     Posture/Postural Control   Posture/Postural Control No significant limitations     ROM / Strength   AROM / PROM / Strength AROM;Strength     AROM   Overall AROM  Within functional limits for tasks performed     Strength   Overall Strength Within functional limits for tasks performed     Bed Mobility   Bed Mobility Sit to Supine;Supine to Sit   Supine to Sit 7: Independent   Sit to Supine 7: Independent  Transfers   Transfers Sit to Stand   Sit to Stand 6: Modified independent (Device/Increase time)   Comments to RW     Ambulation/Gait   Ambulation/Gait Yes   Ambulation/Gait Assistance 6: Modified independent (Device/Increase time)   Ambulation Distance (Feet) 200 Feet   Ambulation Surface Level;Indoor   Gait velocity 2.05 ft/sec with RW, 1.73 ft/sec without device    Stairs Yes   Stairs Assistance 4: Min assist   Stair Management Technique One rail Right;Step to pattern;Alternating pattern   Number of Stairs 4     Standardized Balance Assessment   Standardized Balance Assessment Berg Balance Test     Berg Balance Test   Sit to Stand Able to stand without using hands and stabilize independently   Standing Unsupported Able to stand safely 2 minutes   Sitting with Back Unsupported but Feet Supported on Floor or Stool Able to  sit safely and securely 2 minutes   Stand to Sit Controls descent by using hands   Transfers Able to transfer safely, definite need of hands   Standing Unsupported with Eyes Closed Able to stand 3 seconds   Standing Ubsupported with Feet Together Able to place feet together independently but unable to hold for 30 seconds   From Standing, Reach Forward with Outstretched Arm Can reach forward >12 cm safely (5")   From Standing Position, Pick up Object from Floor Able to pick up shoe, needs supervision   From Standing Position, Turn to Look Behind Over each Shoulder Turn sideways only but maintains balance   Turn 360 Degrees Needs close supervision or verbal cueing   Standing Unsupported, Alternately Place Feet on Step/Stool Able to complete >2 steps/needs minimal assist   Standing Unsupported, One Foot in Front Able to take small step independently and hold 30 seconds   Standing on One Leg Unable to try or needs assist to prevent fall   Total Score 34   Berg comment: 34/56 indicating high fall risk                           PT Education - 05/17/16 1502    Education provided Yes   Education Details HEP: balance corner exercises, sit<>stand    Person(s) Educated Patient;Child(ren)   Methods Explanation;Demonstration;Handout   Comprehension Verbalized understanding;Returned demonstration          PT Short Term Goals - 05/17/16 1506      PT SHORT TERM GOAL #1   Title The patient will be indep with HEP for balance, coordination, and dynamic activities.   Baseline Target date 06/16/2016   Time 4   Period Weeks     PT SHORT TERM GOAL #2   Title The patient will improve Berg from 34/56 to > or equal to 40/56 to demo dec'ing risk for falls.   Baseline Target date 06/16/2016   Time 4   Period Weeks     PT SHORT TERM GOAL #3   Title The patient will improve gait speed from 2.05 ft/sec with RW to > or equal to 2.7 ft/sec with RW modified indep.   Baseline Target date  06/16/16   Time 4   Period Weeks     PT SHORT TERM GOAL #4   Title The patient will negotiate 14 steps with reciprocal pattern and R handrail modified indep.   Baseline Target date 06/16/2016   Time 4   Period Weeks     PT SHORT TERM GOAL #5  Title The patient will ambulate without device x 250 feet modified indep (slower pace, intermittent UE support) to demo ability to negotiate household surfaces without a device.   Baseline Target date 06/16/2016   Time 4   Period Weeks           PT Long Term Goals - 05/17/16 1510      PT LONG TERM GOAL #1   Title The patient will improve neuro QOL by 12% (baseline is 34.3%)   Baseline Target date 07/17/2016   Time 8   Period Weeks     PT LONG TERM GOAL #2   Title The patient will improve Berg from 34/56 to > or equal to 44/56 to demo dec'd risk for falls.   Baseline Target date 07/17/2016   Time 8   Period Weeks     PT LONG TERM GOAL #3   Title The patient will negotiate 4 steps without rails with modified indep for community mobility.   Baseline Target date 07/17/2016   Time 8   Period Weeks     PT LONG TERM GOAL #4   Title The patient will improve gait speed without device from 1.73 ft/sec to > or equal to 2.4 ft/sec to demo dec'ing dependence on RW.   Baseline Target date 07/17/2016   Time 8   Period Weeks     PT LONG TERM GOAL #5   Title The patient will negotiate community surfaces x 1000 ft without a device modified indep.   Baseline Target date 07/17/2016   Time 8   Period Weeks               Plan - 05/17/16 1512    Clinical Impression Statement The patient is a 56 yo female presenting to OP rehab s/p CVA on 05/04/16.  She presents to PT today with decreased balance, L side dyscoordination, and nausea/internal dizziness with motion.  PT to address deficits and promote improved mobility to optimize functional status.   Rehab Potential Good   PT Frequency 2x / week   PT Duration 8 weeks  plan to reduce to 1x/week  after 4 weeks   PT Treatment/Interventions ADLs/Self Care Home Management;Therapeutic activities;Therapeutic exercise;Balance training;Neuromuscular re-education;Gait training;Functional mobility training;Patient/family education;Vestibular   PT Next Visit Plan Check HEP: standing balance activities to progress for home, multi-sensory training, L LE coordination, VOR/gaze x 1 adaptation   Consulted and Agree with Plan of Care Patient      Patient will benefit from skilled therapeutic intervention in order to improve the following deficits and impairments:  Abnormal gait, Decreased activity tolerance, Decreased balance, Decreased mobility, Postural dysfunction, Dizziness, Difficulty walking  Visit Diagnosis: Other abnormalities of gait and mobility  Unsteadiness on feet     Problem List There are no active problems to display for this patient.   Daud Cayer, PT 05/17/2016, 3:15 PM  [facility] [address] [place], [place], [address] Phone: [phone]   Fax:  [phone]  Name: Terri Dickerson MRN: [id number] Date of Birth: 04-28-1960

## 2016-05-17 NOTE — Patient Instructions (Signed)
Feet Together, Varied Arm Positions - Eyes Open    With eyes open, feet together, arms at your side, look straight ahead at a stationary object. Hold __30__ seconds. Repeat _3___ times per session. Do __2__ sessions per day.  Copyright  VHI. All rights reserved.   Feet Apart, Varied Arm Positions - Eyes Closed    Stand with feet shoulder width apart and arms at your side. Close eyes and visualize upright position. Hold __30__ seconds. Repeat __3__ times per session. Do __2__ sessions per day.  Copyright  VHI. All rights reserved.   Functional Quadriceps: Sit to Stand    Sit on edge of chair, feet flat on floor. Stand upright, extending knees fully.  Use your hands at first Repeat __10__ times per set. Do __1_ sets per session. Do __2__ sessions per day.  http://orth.exer.us/735   Copyright  VHI. All rights reserved.

## 2016-05-17 NOTE — Telephone Encounter (Signed)
Patient needs FMLA forms completed by Dr Clelia Croft regarding her stroke, I did not complete any of the forms because I was not sure about her diganosis or her outcome. I will place the forms in you box on 05/17/16 please return them to the FMLA/Disability box at the 102 checkout desk within 5-7 business days. Thank you!

## 2016-05-17 NOTE — Therapy (Signed)
[facility] [address] [place], [place], [address] Phone: [phone]   Fax:  [phone]  Occupational Therapy Evaluation  Patient Details  Name: Terri Dickerson MRN: [id number] Date of Birth: [birth year] Referring Provider: Dr. [name]  Encounter Date: 05/17/2016      OT End of Session - 05/17/16 1200    Visit Number 1   Number of Visits 9   Date for OT Re-Evaluation 06/14/16   Authorization Type [employer] - 60 visits combined   Authorization - Visit Number 1   Authorization - Number of Visits 30   OT Start Time 1022   OT Stop Time 1104   OT Time Calculation (min) 42 min   Activity Tolerance Patient tolerated treatment well      Past Medical History:  Diagnosis Date  . Hyperlipidemia   . Hypertension   . Stroke [facility])     History reviewed. No pertinent surgical history.  There were no vitals filed for this visit.      Subjective Assessment - 05/17/16 1026    Subjective  Occasionally I get headaches but i don't have not today   Patient is accompained by: Family member  daughter    Pertinent History see epic pt with cerebellar stroke   Currently in Pain? No/denies           [facility] OT Assessment - 05/17/16 1027      Assessment   Diagnosis Acute Ischemic stroke, likely cerebellar   Referring Provider Dr. [name]   Onset Date 05/04/16   Prior Therapy Acute Pt and OT only     Precautions   Precautions Fall     Restrictions   Weight Bearing Restrictions No     Balance Screen   Has the patient fallen in the past 6 months No  pt had PT eval today     Home  Environment   Family/patient expects to be discharged to: Private residence   Available Help at Discharge Available 24 hours/day   Type of Home House   Home Layout One level   Bathroom Shower/Tub Tub/Shower unit   [employer]ied [employer] Standard   Additional Comments Pt has transfer tub bench, raised seat with handles, no grab bars.       Prior Function   Level of Independence Independent   Vocation Full time employment   Vocation Requirements CNA at a SNF   Leisure takes care of grandkids     ADL   Eating/Feeding Minimal assistance  for cutting   Grooming Independent   Upper Body Bathing Minimal assistance  for back only   Lower Body Bathing Supervision/safety   Upper Body Dressing Set up   Lower Body Dressing Minimal assistance  to set up pants to allow pt to step into pants   Toilet Tranfer Modified independent   Toileting - Clothing Manipulation Modified independent   Toileting -  Hygiene Modified Independent   Tub/Shower Transfer Supervision/safety     IADL   Shopping Completely unable to shop   Light Housekeeping Does not participate in any housekeeping tasks   Meal Prep Needs to have meals prepared and served   [employer] on family or friends for transportation   Medication Management --  dtr manages pills are small and hard to pick up   Development worker, community financial matters independently (budgets, writes checks, pays rent, bills goes to bank), collects and keeps track of income     Mobility   Mobility Status  Needs assist   Mobility Status Comments needs supervision and occassional min a outside with walker     Written Expression   Dominant Hand Right     Vision - History   Baseline Vision Wears glasses only for reading   Additional Comments Pt denies visual changes.      Vision Assessment   Eye Alignment Impaired (comment)  mild; pt denies diplopia   Ocular Range of Motion Within Functional Limits   Tracking/Visual Pursuits Able to track stimulus in all quads without difficulty  disequilibrium sensation increases with rapid tracking   Saccades Undershoots  with vertical saccades only   Convergence Impaired (comment)   Visual Fields No apparent deficits   Diplopia Assessment --  denies   Comment mildly impaired VOR, gaze stabilization with vertical head movements      Activity Tolerance   Activity Tolerance Tolerates 30 min activity with muliple rests   Activity Tolerance Comments Pt with L bias upon standing with narrow BOS and with functional ambulation - pt describes feeling "pulled" to L side. Denies any true vertigo, describes disequilibrium. Pt also stated that this feeling is more pronounced when she rides in a car (visual flow)     Cognition   Overall Cognitive Status Within Functional Limits for tasks assessed   Mini Mental State Exam  Will continue to monitor via functional tasks.     Sensation   Light Touch Appears Intact   Hot/Cold Appears Intact   Proprioception Appears Intact     Coordination   Gross Motor Movements are Fluid and Coordinated Yes   Fine Motor Movements are Fluid and Coordinated No   Finger Nose Finger Test moderate impairment   9 Hole Peg Test Right;Left   Right 9 Hole Peg Test 21.29   Left 9 Hole Peg Test 32.13     Tone   Assessment Location Left Upper Extremity     ROM / Strength   AROM / PROM / Strength AROM;Strength     AROM   Overall AROM  Within functional limits for tasks performed   Overall AROM Comments BUE's     Strength   Overall Strength Within functional limits for tasks performed   Overall Strength Comments BUE's     Hand Function   Right Hand Gross Grasp Functional   Right Hand Grip (lbs) 75   Left Hand Gross Grasp Functional   Left Hand Grip (lbs) 75     LUE Tone   LUE Tone Within Functional Limits                              OT Long Term Goals - 05/17/16 1150      OT LONG TERM GOAL #1   Title Pt will be mod I with HEP- 06/14/2016   Status New     OT LONG TERM GOAL #2   Title Pt will demonstrate improved coordination in LUE as evidenced by ecreased time on 9 hole peg by at least 5 seconds (baseline= 32.13)   Status New     OT LONG TERM GOAL #3   Title Pt will be mod I with basic ADL's (bathing, dressing)   Status New     OT LONG TERM GOAL #4   Title  Pt will be mod I with shower transfers   Status New     OT LONG TERM GOAL #5   Title Pt will be mod I with simple hot  meal prep   Status New     OT LONG TERM GOAL #6   Title Pt will report return to basic home mgmt tasks including dishes, making bed, doing laundry   Status New     OT LONG TERM GOAL #7   Title Pt will be able to cut piece of meat on plate when eating   Status New               Plan - 05/17/16 1154    Clinical Impression Statement Pt is a 56 year old female s/p acute ischemic stroke on 05/04/2016 in [place] - pt dicharged from hospital on 22/2018 and returned to [place].  PMH:  HLD, HTN.  Pt presents today with the following deficits that impact ADL, IADL. leisure and return to work (pt was employed full time as Lawyer in a SNF):  decreasd coordination L non dominant hand, decreased functional use of LUE, decreased balance, decreased visual vestibular integration, decreased activity tolerance, decreased functional mobility.  Pt will benefit from skilled OT to address these deficts to maximize her independence.     Rehab Potential Good   OT Frequency 2x / week   OT Duration 4 weeks   OT Treatment/Interventions Self-care/ADL training;Neuromuscular education;Therapeutic exercise;Energy conservation;DME and/or AE instruction;Building services engineer;Therapeutic activities;Visual/perceptual remediation/compensation;Patient/family education;Balance training   Plan review goals, POC, initiate HEP for cooridination and if time vision.  Assess reading.   Consulted and Agree with Plan of Care Patient;Family member/caregiver   Family Member Consulted daughter      Patient will benefit from skilled therapeutic intervention in order to improve the following deficits and impairments:  Decreased activity tolerance, Decreased balance, Decreased coordination, Decreased mobility, Decreased knowledge of use of DME, Difficulty walking, Impaired UE functional use, Impaired  vision/preception  Visit Diagnosis: Other lack of coordination - Plan: Ot plan of care cert/re-cert  Unsteadiness on feet - Plan: Ot plan of care cert/re-cert  Ataxia - Plan: Ot plan of care cert/re-cert  Visuospatial deficit - Plan: Ot plan of care cert/re-cert    Problem List There are no active problems to display for this patient.   Norton Pastel, OTR/L 05/17/2016, 12:06 PM  [facility] [address] [place], [place], [address] Phone: [phone]   Fax:  [phone]  Name: Terri Dickerson MRN: [id number] Date of Birth: [birth year]

## 2016-05-17 NOTE — Telephone Encounter (Signed)
Pt daughter wanted to know is there any other medication that pt can take besides her daily asprin for a headache..  Please advise pt daughter : [phone]

## 2016-05-17 NOTE — Telephone Encounter (Signed)
LM on machine. May use OTC acetaminophen. Advised if headaches are frequent, or do not resolve with acetaminophen, RTC for evaluation.

## 2016-05-18 ENCOUNTER — Telehealth: Payer: Self-pay | Admitting: Family Medicine

## 2016-05-18 NOTE — Telephone Encounter (Signed)
Spoke with pt's daughter about sending Neuro referral to both [facility] and [place] as no local offices have openings until July. States that this would be okay as she wants her mother seen as soon as possible.  Also requesting a handicap placard. I am unsure of the policy for receiving this, please contact pt's daughter if they need to do anything on their end.   Thank you!

## 2016-05-18 NOTE — Telephone Encounter (Signed)
In your box

## 2016-05-18 NOTE — Telephone Encounter (Signed)
Will you fill this out for patient?

## 2016-05-18 NOTE — Telephone Encounter (Signed)
We can complete these forms at her 5/14 OV.  If she needs them sooner than that she should come in for an appt so we can discuss what she does, when she is going to go back and what restrictions/limitations/exceptions she might need when she does and then complete the forms during the visit

## 2016-05-18 NOTE — Telephone Encounter (Signed)
Sure, put pt's info on the handcap parking form and fill out out clinic info at the bottom and then put it in my box for me to sign, thanks

## 2016-05-25 ENCOUNTER — Ambulatory Visit: Payer: 59 | Attending: Psychiatry | Admitting: Physical Therapy

## 2016-05-25 ENCOUNTER — Ambulatory Visit: Payer: 59 | Admitting: Occupational Therapy

## 2016-05-25 ENCOUNTER — Encounter: Payer: Self-pay | Admitting: Physical Therapy

## 2016-05-25 DIAGNOSIS — R2689 Other abnormalities of gait and mobility: Secondary | ICD-10-CM | POA: Insufficient documentation

## 2016-05-25 DIAGNOSIS — R2681 Unsteadiness on feet: Secondary | ICD-10-CM | POA: Insufficient documentation

## 2016-05-25 DIAGNOSIS — R278 Other lack of coordination: Secondary | ICD-10-CM | POA: Diagnosis present

## 2016-05-25 DIAGNOSIS — R41842 Visuospatial deficit: Secondary | ICD-10-CM | POA: Diagnosis present

## 2016-05-25 DIAGNOSIS — Z0271 Encounter for disability determination: Secondary | ICD-10-CM

## 2016-05-25 NOTE — Patient Instructions (Addendum)
  Coordination Activities  Perform the following activities for 15-20 minutes 1 times per day with left hand(s).   Rotate ball in fingertips (clockwise and counter-clockwise).  Toss ball between hands.  Toss ball in air and catch with the same hand.  Flip cards 1 at a time as fast as you can.  Deal cards with your thumb (Hold deck in hand and push card off top with thumb).  Pick up coins and stack.  Pick up coins one at a time until you get 5-10 in your hand, then move coins from palm to fingertips to place in container/coin bank one at a time.

## 2016-05-25 NOTE — Patient Instructions (Addendum)
Gaze Stabilization: Tip Card  1.Target must remain in focus, not blurry, and appear stationary while head is in motion.  2.Perform exercises with small head movements (45 to either side of midline).  3.Increase speed of head motion so long as target is in focus.  4.If you wear eyeglasses, be sure you can see target through lens (therapist will give specific instructions for bifocal / progressive lenses).  5.These exercises may provoke dizziness or nausea. Work through these symptoms. If too dizzy, slow head movement slightly. Rest between each exercise.  6.Exercises demand concentration; avoid distractions.  7.For safety, perform standing exercises close to a counter, wall, corner, or next to someone.- HOLD RW for now.   Copyright  VHI. All rights reserved.     Gaze Stabilization: Standing Feet Apart    Feet shoulder width apart, keeping eyes on target on wall __5-6__ feet away, tilt head down 15-30 and move head side to side for _20_ seconds. Repeat while moving head up and down for _20__ seconds. Do __1-2__ sessions per day.  Copyright  VHI. All rights reserved.   Perform these in a corner with a chair in front of you for safety:     Feet Together, Varied Arm Positions - Eyes Closed    Stand with feet together and arms at sides. Close eyes and visualize upright position. Hold _30_ seconds. Repeat _3_ times per session. Do _1-2_ sessions per day.  Copyright  VHI. All rights reserved.    Feet Apart, Head Motion - Eyes Closed    With eyes closed and feet shoulder width apart, move head slowly: 1. Up and down x 10 reps 2. Left and right x 10 reps 3. Diagonals both ways x 10 reps Do _1-2_ sessions per day.  Copyright  VHI. All rights reserved.    Functional Quadriceps: Sit to Stand    Sit on edge of chair, feet flat on floor. Stand upright, extending knees fully. Repeat _10_ times per set. Do _1_ sets per session. Do __1-2 sessions per  day.  http://orth.exer.us/735   Copyright  VHI. All rights reserved.

## 2016-05-25 NOTE — Therapy (Signed)
[facility] [address] [place], [place], [address] Phone: [phone]   Fax:  [phone]  Occupational Therapy Treatment  Patient Details  Name: Terri Dickerson MRN: [id number] Date of Birth: [birth year] Referring Provider: Dr. [name]  Encounter Date: 05/25/2016      OT End of Session - 05/25/16 0903    Visit Number 2   Number of Visits 9   Date for OT Re-Evaluation 06/14/16   Authorization Type [employer] - 60 visits combined   Authorization - Visit Number 2   Authorization - Number of Visits 30   OT Start Time 0850   OT Stop Time 0930   OT Time Calculation (min) 40 min   Activity Tolerance Patient tolerated treatment well   Behavior During Therapy [facility] for tasks assessed/performed      Past Medical History:  Diagnosis Date  . Hyperlipidemia   . Hypertension   . Stroke [facility])     No past surgical history on file.  There were no vitals filed for this visit.      Subjective Assessment - 05/25/16 0849    Subjective  Pt reports that she is dressing without assist (but hasn't attempted tying shoes or buttons), able to peel an apple, bathing without assistance   Patient is accompained by: Family member  daughter    Pertinent History see epic pt with cerebellar stroke   Currently in Pain? No/denies       Copying small peg design for visual scanning and coordination with min difficulty with coordination and mod incr time (and min cues initially) to copy design correctly.     Reviewed goals and pt/dtr verbalized agreement.  Environmental scanning:  Ambulating with RW to locate/retrieve items in min distracting environment with 11/15 items found on first attempt with remaining found on 2nd attempt.  Pt denies dizziness/feeling of imbalance.  No LOB.                        OT Education - 05/25/16 0906    Education provided Yes   Education Details Coordination HEP--see pt instructions   Person(s) Educated Patient   Methods Explanation;Demonstration;Verbal cues;Handout   Comprehension Verbalized understanding;Returned demonstration;Verbal cues required             OT Long Term Goals - 05/25/16 0908      OT LONG TERM GOAL #1   Title Pt will be mod I with HEP- 06/14/2016   Status New     OT LONG TERM GOAL #2   Title Pt will demonstrate improved coordination in LUE as evidenced by ecreased time on 9 hole peg by at least 5 seconds (baseline= 32.13)   Status New     OT LONG TERM GOAL #3   Title Pt will be mod I with basic ADL's (bathing, dressing)   Status New     OT LONG TERM GOAL #4   Title Pt will be mod I with shower transfers   Status New     OT LONG TERM GOAL #5   Title Pt will be mod I with simple hot meal prep   Status New     OT LONG TERM GOAL #6   Title Pt will report return to basic home mgmt tasks including dishes, making bed, doing laundry   Status New     OT LONG TERM GOAL #7   Title Pt will be able to cut piece of meat on plate when eating  Status New               Plan - 05/25/16 [id number]    Clinical Impression Statement Pt/family report that pt is progressing towards goals for improved ADL performance and that she is increasing LUE functional use.   Rehab Potential Good   OT Frequency 2x / week   OT Duration 4 weeks   OT Treatment/Interventions Self-care/ADL training;Neuromuscular education;Therapeutic exercise;Energy conservation;DME and/or AE instruction;Building services engineerunctional Mobility Training;Therapeutic activities;Visual/perceptual remediation/compensation;Patient/family education;Balance training   Plan Assess reading, further assess vision   OT Home Exercise Plan Education provided:  Coordination HEP   Consulted and Agree with Plan of Care Patient;Family member/caregiver   Family Member Consulted daughter      Patient will benefit from skilled therapeutic intervention in order to improve the following deficits and impairments:   Decreased activity tolerance, Decreased balance, Decreased coordination, Decreased mobility, Decreased knowledge of use of DME, Difficulty walking, Impaired UE functional use, Impaired vision/preception  Visit Diagnosis: Other lack of coordination  Visuospatial deficit  Other abnormalities of gait and mobility  Unsteadiness on feet    Problem List There are no active problems to display for this patient.   [facility]FREEMAN,ANGELA 05/25/2016, 3:22 PM  [facility] [address] [place], [place], [address] Phone: [phone]   Fax:  [phone]  Name: Terri Dickerson MRN: [id number] Date of Birth: Jul 08, 1960   Willa FraterAngela Freeman, OTR/L [facility] [address] [place], [place]  [address] [phone] phone [phone] 05/25/16 3:22 PM

## 2016-05-25 NOTE — Therapy (Signed)
[facility] [address] [place], [place], [address] Phone: [phone]   Fax:  [phone]  Physical Therapy Treatment  Patient Details  Name: Terri Dickerson MRN: [id number] Date of Birth: 1960/05/07 Referring Provider: [name], MD  Encounter Date: 05/25/2016      PT End of Session - 05/25/16 0811    Visit Number 2   Number of Visits 12   Date for PT Re-Evaluation 07/16/16   Authorization Type private insurance   PT Start Time [phone]   PT Stop Time 0845   PT Time Calculation (min) 39 min   Equipment Utilized During Treatment Gait belt   Activity Tolerance Patient tolerated treatment well   Behavior During Therapy [facility] for tasks assessed/performed      Past Medical History:  Diagnosis Date  . Hyperlipidemia   . Hypertension   . Stroke [facility])     History reviewed. No pertinent surgical history.  There were no vitals filed for this visit.      Subjective Assessment - 05/25/16 0810    Subjective No new complaints. No falls or pain to report.    Patient Stated Goals Get back on center and get close to 100% if I can.  "Get back to work", "Get back to me".   Currently in Pain? No/denies     Treatment: Reviewed HEP issued at eval with pt performing corner balance activities with no issues noted.  Advanced HEP as follows: Gaze Stabilization: Tip Card  1.Target must remain in focus, not blurry, and appear stationary while head is in motion.  2.Perform exercises with small head movements (45 to either side of midline).  3.Increase speed of head motion so long as target is in focus.  4.If you wear eyeglasses, be sure you can see target through lens (therapist will give specific instructions for bifocal / progressive lenses).  5.These exercises may provoke dizziness or nausea. Work through these symptoms. If too dizzy, slow head movement slightly. Rest between each exercise.  6.Exercises demand  concentration; avoid distractions.  7.For safety, perform standing exercises close to a counter, wall, corner, or next to someone.- HOLD RW for now.   Copyright  VHI. All rights reserved.     Gaze Stabilization: Standing Feet Apart    Feet shoulder width apart, keeping eyes on target on wall __5-6__ feet away, tilt head down 15-30 and move head side to side for _20_ seconds. Repeat while moving head up and down for _20__ seconds. Do __1-2__ sessions per day.  Copyright  VHI. All rights reserved.   Perform these in a corner with a chair in front of you for safety:     Feet Together, Varied Arm Positions - Eyes Closed    Stand with feet together and arms at sides. Close eyes and visualize upright position. Hold _30_ seconds. Repeat _3_ times per session. Do _1-2_ sessions per day.  Copyright  VHI. All rights reserved.    Feet Apart, Head Motion - Eyes Closed    With eyes closed and feet shoulder width apart, move head slowly: 1. Up and down x 10 reps 2. Left and right x 10 reps 3. Diagonals both ways x 10 reps Do _1-2_ sessions per day.  Copyright  VHI. All rights reserved.    Functional Quadriceps: Sit to Stand    Sit on edge of chair, feet flat on floor. Stand upright, extending knees fully. Repeat _10_ times per set. Do _1_ sets per session. Do __1-2 sessions  per day.  http://orth.exer.us/735   Copyright  VHI. All rights reserved.          PT Education - 05/25/16 1003    Education provided Yes   Education Details advaced HEP   Person(s) Educated Patient;Child(ren)   Methods Explanation;Demonstration;Verbal cues;Handout   Comprehension Verbalized understanding;Returned demonstration;Verbal cues required;Need further instruction            PT Short Term Goals - 05/17/16 1506      PT SHORT TERM GOAL #1   Title The patient will be indep with HEP for balance, coordination, and dynamic activities.   Baseline Target date 06/16/2016   Time 4    Period Weeks     PT SHORT TERM GOAL #2   Title The patient will improve Berg from 34/56 to > or equal to 40/56 to demo dec'ing risk for falls.   Baseline Target date 06/16/2016   Time 4   Period Weeks     PT SHORT TERM GOAL #3   Title The patient will improve gait speed from 2.05 ft/sec with RW to > or equal to 2.7 ft/sec with RW modified indep.   Baseline Target date 06/16/16   Time 4   Period Weeks     PT SHORT TERM GOAL #4   Title The patient will negotiate 14 steps with reciprocal pattern and R handrail modified indep.   Baseline Target date 06/16/2016   Time 4   Period Weeks     PT SHORT TERM GOAL #5   Title The patient will ambulate without device x 250 feet modified indep (slower pace, intermittent UE support) to demo ability to negotiate household surfaces without a device.   Baseline Target date 06/16/2016   Time 4   Period Weeks           PT Long Term Goals - 05/17/16 1510      PT LONG TERM GOAL #1   Title The patient will improve neuro QOL by 12% (baseline is 34.3%)   Baseline Target date 07/17/2016   Time 8   Period Weeks     PT LONG TERM GOAL #2   Title The patient will improve Berg from 34/56 to > or equal to 44/56 to demo dec'd risk for falls.   Baseline Target date 07/17/2016   Time 8   Period Weeks     PT LONG TERM GOAL #3   Title The patient will negotiate 4 steps without rails with modified indep for community mobility.   Baseline Target date 07/17/2016   Time 8   Period Weeks     PT LONG TERM GOAL #4   Title The patient will improve gait speed without device from 1.73 ft/sec to > or equal to 2.4 ft/sec to demo dec'ing dependence on RW.   Baseline Target date 07/17/2016   Time 8   Period Weeks     PT LONG TERM GOAL #5   Title The patient will negotiate community surfaces x 1000 ft without a device modified indep.   Baseline Target date 07/17/2016   Time 8   Period Weeks           Plan - 05/25/16 [id number]    Clinical Impression Statement  Today's skilled session reviewed pt's current HEP and advanced it appropriately. Pt is making steady progress toward goals and should benefit from continued PT to progress toward unmet goals.    Rehab Potential Good   PT Frequency 2x / week   PT Duration 8  weeks  plan to reduce to 1x/week after 4 weeks   PT Treatment/Interventions ADLs/Self Care Home Management;Therapeutic activities;Therapeutic exercise;Balance training;Neuromuscular re-education;Gait training;Functional mobility training;Patient/family education;Vestibular   PT Next Visit Plan standing balance activities to progress for home, multi-sensory training, L LE coordination,   Consulted and Agree with Plan of Care Patient      Patient will benefit from skilled therapeutic intervention in order to improve the following deficits and impairments:  Abnormal gait, Decreased activity tolerance, Decreased balance, Decreased mobility, Postural dysfunction, Dizziness, Difficulty walking  Visit Diagnosis: Unsteadiness on feet  Other abnormalities of gait and mobility     Problem List There are no active problems to display for this patient.   Sallyanne Kuster, PTA, [facility] [address] [place], [place] [address] [phone] 05/25/16, 10:01 AM   Name: Terri Dickerson MRN: [id number] Date of Birth: [birth year]

## 2016-05-28 ENCOUNTER — Encounter: Payer: Self-pay | Admitting: Family Medicine

## 2016-05-28 ENCOUNTER — Ambulatory Visit: Payer: 59 | Admitting: Rehabilitative and Restorative Service Providers"

## 2016-05-28 ENCOUNTER — Ambulatory Visit (INDEPENDENT_AMBULATORY_CARE_PROVIDER_SITE_OTHER): Payer: Managed Care, Other (non HMO) | Admitting: Family Medicine

## 2016-05-28 VITALS — BP 120/78 | HR 103 | Temp 99.3°F | Resp 17 | Ht 63.5 in | Wt 218.0 lb

## 2016-05-28 DIAGNOSIS — R2689 Other abnormalities of gait and mobility: Secondary | ICD-10-CM

## 2016-05-28 DIAGNOSIS — I10 Essential (primary) hypertension: Secondary | ICD-10-CM

## 2016-05-28 DIAGNOSIS — Z8673 Personal history of transient ischemic attack (TIA), and cerebral infarction without residual deficits: Secondary | ICD-10-CM | POA: Diagnosis not present

## 2016-05-28 DIAGNOSIS — Z5181 Encounter for therapeutic drug level monitoring: Secondary | ICD-10-CM | POA: Diagnosis not present

## 2016-05-28 DIAGNOSIS — I63542 Cerebral infarction due to unspecified occlusion or stenosis of left cerebellar artery: Secondary | ICD-10-CM | POA: Diagnosis not present

## 2016-05-28 DIAGNOSIS — R2681 Unsteadiness on feet: Secondary | ICD-10-CM | POA: Diagnosis not present

## 2016-05-28 DIAGNOSIS — I69393 Ataxia following cerebral infarction: Secondary | ICD-10-CM

## 2016-05-28 NOTE — Progress Notes (Signed)
By signing my name below, I, Terri Dickerson, attest that this documentation has been prepared under the direction and in the presence of Terri SorensonEva Shaw, MD.  Electronically Signed: Arvilla MarketMesha Dickerson, Medical Scribe. 05/28/16. 4:11 PM.  Subjective:    Patient ID: Terri Rilesosalind E Elenbaas, female    DOB: 07/07/1960, 56 y.o.   MRN: [id number]  HPI Chief Complaint  Patient presents with  . FMLA paperwork    HPI Comments: Terri RilesRosalind E Dickerson is a 56 y.o. female who [facility]. At pt's initial visit 2 weeks prior, we increased her lisinopril from 10 to 20 mg and started her on HCTZ 25 mg. [place] 5 days. Was not having routine medical care. Presented with dizziness on 4/17. Her potassium wad 3.2 when checked 2 weeks ago, so put on potassium supplements; 20 QD for 1 week. Referred pt to neurology. She was also referred for a sleep study, she started PT and OT. She has an appt scheduled with Dr. Frances Dickerson on 06/15/16. Pt had a MRI that showed a cerebellar stroke, acute infarction of the left cerebellar hemispheres and left cerebellar tonsil. She had a CTA of the head and neck with no pathologic findings and common carotid arteries and bifurcations. Vertebral arteries are nl. Echocardiogram 4/18 with bubble study, EF > 55%, nl left ventricles, dilated inferior vena cava suggested right arteriole pressures. No interatrial shunts. No suggestion of pulmonary HTN. 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  Pt would like to get her disability set on 05/04/16. Pt works at which used to be [employer], but is now a [employer] and she isn't sire if she has a long term or short term disability plan. Pt's light duty includes taking bp, feedings, answering phones, and other various activities. Reports upper body strength is fine and she can use forks and knives. Pt just saw the neurologist [facility] yesterday  and would like to get copies of her records here to determine how much improvement she could potentially have. States PT is helping her dizziness and nausea but still gets dizzy with nausea when getting up and laying down. Pt's bp was high yesterday due to taking her medication later than usual and traveling. Pt has PT for 4 week and OT is for 6 weeks - booked her schedule. Denies falls, and dizziness with sitting down.  There are no active problems to display for this patient.  Past Medical History:  Diagnosis Date  . Hyperlipidemia   . Hypertension   . Stroke [facility](HCC)    No past surgical history on file. No Known Allergies Prior to Admission medications   Medication Sig Start Date End Date Taking? Authorizing Provider  amLODipine (NORVASC) 10 MG tablet Take 1 tablet (10 mg total) by mouth daily. 05/04/15  Yes Ward, Layla MawKristen N, DO  aspirin 81 MG chewable tablet Chew by mouth daily.   Yes [provider]  atorvastatin (LIPITOR) 80 MG tablet Take 80 mg by mouth daily.   Yes [provider]  carvedilol (COREG) 12.5 MG tablet Take 12.5 mg by mouth 2 (two) times daily with a meal.   Yes [provider]  hydrochlorothiazide (HYDRODIURIL) 25 MG tablet Take 1 tablet (25 mg total) by mouth daily. 05/13/16  Yes Sherren MochaShaw, Terri N, MD  lisinopril (PRINIVIL,ZESTRIL) 10 MG tablet Take 2 tablets (20 mg total) by mouth daily. 05/13/16  Yes Sherren MochaShaw, Terri N, MD  ondansetron (ZOFRAN-ODT) 4 MG disintegrating tablet Take 4 mg by mouth every 8 (eight) hours as needed for nausea or vomiting.   Yes [provider]  potassium chloride SA (K-DUR,KLOR-CON) 20 MEQ tablet Take 1 tablet (20 mEq total) by mouth daily. 05/14/16  Yes Sherren Mocha, MD   Social History   Social History  . Marital status: Married    Spouse name: Terri Dickerson  . Number of children: Terri Dickerson  . Years of education: Terri Dickerson   Occupational History  . Not on file.   Social History Main Topics  . Smoking status: Never Smoker  . Smokeless  tobacco: Never Used  . Alcohol use 0.0 oz/week  . Drug use: No  . Sexual activity: Not on file   Other Topics Concern  . Not on file   Social History Narrative  . No narrative on file   Review of Systems  Gastrointestinal: Positive for nausea.  Neurological: Positive for dizziness. Negative for weakness.   Objective:  Physical Exam  Constitutional: She appears well-developed and well-nourished. No distress.  HENT:  Head: Normocephalic and atraumatic.  Eyes: Conjunctivae are normal.  Neck: Neck supple.  Cardiovascular: Normal rate, regular rhythm and normal heart sounds.  Exam reveals no gallop and no friction rub.   No murmur heard. Pulmonary/Chest: Effort normal and breath sounds normal. No respiratory distress. She has no wheezes. She has no rales.  Lymphadenopathy:    She has no cervical adenopathy.  Neurological: She is alert.  Skin: Skin is warm and dry.  Psychiatric: She has a normal mood and affect. Her behavior is normal.  Nursing note and vitals reviewed.   Vitals:   05/28/16 1547  BP: 120/78  Pulse: (!) 103  Resp: 17  Temp: 99.3 F (37.4 C)  TempSrc: Oral  SpO2: 97%  Weight: 218 lb (98.9 kg)  Height: 5' 3.5" (1.613 m)  Body mass index is 38.01 kg/m. Assessment & Plan:   Send copy if my notes and labs to: [facility]. Dr. Charletta Cousin [address] [place] [address] Fax: [phone] Phone: [phone]   1. Essential hypertension  - at goal on current regimen of lisinopril 20, hctz 25, coreg 12.5 bid, and amlodipine 10 - check bmp since increased lisinopril and started hctz at last visit. Recheck with me in 2 mos to discuss in what capacity pt is going to be able to RTW.  2. Medication monitoring encounter   3. History of cerebellar stroke   4. 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 Had neurology appt yet - will forward records. FMLA forms - out through 7/17 - will RTC in July  to address any poss RTW. Advised pt to contact her employer HR to see if she has any disability insurance and if not than she should start applying for DSS Medical Disability asap due to lengthy process because she is going to need substantial improvement to be a ble to return to her prior job and there is a good chance she will not be able to get to the level of fxn that would be required for that.  5. 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 Basic metabolic panel    Order Specific Question:   Has the patient fasted?    Answer:   No  . Care order/instruction:    Scheduling Instructions:  Complete orders, AVS and go.      I personally performed the services described in this documentation, which was scribed in my presence. The recorded information has been reviewed and considered, and addended by me as needed.   Terri Dickerson, M.D.  Primary Care at [facility] [address] [place], [place] [address] [phone] phone [phone] fax  05/28/16 10:29 PM

## 2016-05-28 NOTE — Therapy (Signed)
[facility] [address] [place], [place], [address] Phone: [phone]   Fax:  [phone]  Physical Therapy Treatment  Patient Details  Name: RASHADA KLONTZ MRN: [id number] Date of Birth: 1960-09-26 Referring Provider: Delman Cheadle, MD  Encounter Date: 05/28/2016      PT End of Session - 05/28/16 0934    Visit Number 3   Number of Visits 12   Date for PT Re-Evaluation 07/16/16   Authorization Type private insurance   PT Start Time 0932   PT Stop Time 1015   PT Time Calculation (min) 43 min   Equipment Utilized During Treatment Gait belt   Activity Tolerance Patient tolerated treatment well   Behavior During Therapy [facility] for tasks assessed/performed      Past Medical History:  Diagnosis Date  . Hyperlipidemia   . Hypertension   . Stroke [facility])     No past surgical history on file.  There were no vitals filed for this visit.      Subjective Assessment - 05/28/16 1146    Subjective The patient reports she is walking in home without device holding walls as needed.    Patient Stated Goals Get back on center and get close to 100% if I can.  "Get back to work", "Get back to me".   Currently in Pain? No/denies            [facility] PT Assessment - 05/28/16 0945      Ambulation/Gait   Gait velocity 4.05 ft/sec without device.    Stairs Yes   Stairs Assistance 6: Modified independent (Device/Increase time)   Stair Management Technique One rail Right;Alternating pattern   Number of Stairs 16   Gait Comments Recommended home walking program.      Standardized Balance Assessment   Standardized Balance Assessment Berg Balance Test     Berg Balance Test   Sit to Stand Able to stand without using hands and stabilize independently   Standing Unsupported Able to stand safely 2 minutes   Sitting with Back Unsupported but Feet Supported on Floor or Stool Able to sit safely and securely 2 minutes   Stand to Sit  Sits safely with minimal use of hands   Transfers Able to transfer safely, minor use of hands   Standing Unsupported with Eyes Closed Able to stand 10 seconds safely   Standing Ubsupported with Feet Together Able to place feet together independently and stand 1 minute safely   From Standing, Reach Forward with Outstretched Arm Can reach confidently >25 cm (10")   From Standing Position, Pick up Object from Floor Able to pick up shoe safely and easily   From Standing Position, Turn to Look Behind Over each Shoulder Looks behind from both sides and weight shifts well   Turn 360 Degrees Able to turn 360 degrees safely but slowly   Standing Unsupported, Alternately Place Feet on Step/Stool Able to stand independently and safely and complete 8 steps in 20 seconds   Standing Unsupported, One Foot in Front Able to take small step independently and hold 30 seconds   Standing on One Leg Tries to lift leg/unable to hold 3 seconds but remains standing independently   Total Score 49   Berg comment: 49/56 indicating lower fall risk.            Vestibular Assessment - 05/28/16 0949      Positional Testing   Sidelying Test Sidelying Right;Sidelying Left   Horizontal Canal Testing Horizontal Canal  Right;Horizontal Canal Left     Sidelying Right   Sidelying Right Duration mild sensation of dizziness, no nausea   Sidelying Right Symptoms No nystagmus     Sidelying Left   Sidelying Left Duration Worse on the L side, but sensation of nausea and spinning in her head rated "mild"   Sidelying Left Symptoms No nystagmus     Horizontal Canal Right   Horizontal Canal Right Duration moves slowly, no dizziness or nausea   Horizontal Canal Right Symptoms Normal     Horizontal Canal Left   Horizontal Canal Left Duration moves slowly   Horizontal Canal Left Symptoms Normal                 [facility] Adult PT Treatment/Exercise - 05/28/16 0945      Ambulation/Gait   Ambulation/Gait Yes    Ambulation/Gait Assistance 5: Supervision   Ambulation/Gait Assistance Details patient walked outdoors with CGA to close supervision on grass, parking lot, and sidewalk surfaces   Ambulation Distance (Feet) 800 Feet   Assistive device None;Rolling walker         Vestibular Treatment/Exercise - 05/28/16 0955      Vestibular Treatment/Exercise   Vestibular Treatment Provided Habituation;Gaze   Habituation Exercises Laruth Bouchard Daroff;Horizontal Roll   Gaze Exercises X1 Viewing Horizontal     Nestor Lewandowsky   Number of Reps  3   Symptom Description  with symptoms improving with repetition (initially had nausea to right, and this settled after first rep)     Horizontal Roll   Number of Reps  3   Symptom Description  with cues to increase speed of movement     X1 Viewing Horizontal   Foot Position standing   Comments x 30 seconds horiz/vertical with cues on speed and maintaining gaze fixation.               PT Education - 05/28/16 0958    Education provided Yes   Education Details HEP:  Added habituation to HEP   Person(s) Educated Patient;Caregiver(s)   Methods Explanation;Demonstration;Handout   Comprehension Verbalized understanding;Returned demonstration          PT Short Term Goals - 05/28/16 1006      PT SHORT TERM GOAL #1   Title The patient will be indep with HEP for balance, coordination, and dynamic activities.   Baseline Target date 06/16/2016   Time 4   Period Weeks     PT SHORT TERM GOAL #2   Title The patient will improve Berg from 34/56 to > or equal to 40/56 to demo dec'ing risk for falls.   Baseline Berg 49/56 on 05/28/16   Time 4   Period Weeks   Status Achieved     PT SHORT TERM GOAL #3   Title The patient will improve gait speed from 2.05 ft/sec with RW to > or equal to 2.7 ft/sec with RW modified indep.   Baseline 4.05 ft/sec without a device (improved from 1.73 ft/sec without device at eval!)   Time 4   Period Weeks   Status Achieved      PT SHORT TERM GOAL #4   Title The patient will negotiate 14 steps with reciprocal pattern and R handrail modified indep.   Baseline Met on 05/28/16   Time 4   Period Weeks   Status Achieved     PT SHORT TERM GOAL #5   Title The patient will ambulate without device x 250 feet modified indep (slower pace, intermittent UE support) to  demo ability to negotiate household surfaces without a device.   Baseline Met on 05/28/16   Time 4   Period Weeks   Status Achieved           PT Long Term Goals - 05/17/16 1510      PT LONG TERM GOAL #1   Title The patient will improve neuro QOL by 12% (baseline is 34.3%)   Baseline Target date 07/17/2016   Time 8   Period Weeks     PT LONG TERM GOAL #2   Title The patient will improve Berg from 34/56 to > or equal to 44/56 to demo dec'd risk for falls.   Baseline Target date 07/17/2016   Time 8   Period Weeks     PT LONG TERM GOAL #3   Title The patient will negotiate 4 steps without rails with modified indep for community mobility.   Baseline Target date 07/17/2016   Time 8   Period Weeks     PT LONG TERM GOAL #4   Title The patient will improve gait speed without device from 1.73 ft/sec to > or equal to 2.4 ft/sec to demo dec'ing dependence on RW.   Baseline Target date 07/17/2016   Time 8   Period Weeks     PT LONG TERM GOAL #5   Title The patient will negotiate community surfaces x 1000 ft without a device modified indep.   Baseline Target date 07/17/2016   Time 8   Period Weeks               Plan - 05/28/16 1147    Clinical Impression Statement The patient has met 4/5 STGs ahead of schedule.  She is making great progress with balance and mobility.  PT encouraging patient to walk more often and added habituation activities to help improve tolerance to head motion/turns.   PT Treatment/Interventions ADLs/Self Care Home Management;Therapeutic activities;Therapeutic exercise;Balance training;Neuromuscular re-education;Gait  training;Functional mobility training;Patient/family education;Vestibular   PT Next Visit Plan standing balance activities to progress for home, multi-sensory training, L LE coordination,  Check habiutation and gaze activities.  Work on turns, foam with eyes closed for home.   Consulted and Agree with Plan of Care Patient      Patient will benefit from skilled therapeutic intervention in order to improve the following deficits and impairments:  Abnormal gait, Decreased activity tolerance, Decreased balance, Decreased mobility, Postural dysfunction, Dizziness, Difficulty walking  Visit Diagnosis: Other abnormalities of gait and mobility  Unsteadiness on feet     Problem List There are no active problems to display for this patient.   [place], [place] 05/28/2016, 11:49 AM  [facility] [address] [place], [place], [address] Phone: [phone]   Fax:  [phone]  Name: LEATA DOMINY MRN: [id number] Date of Birth: 04-Feb-1960

## 2016-05-28 NOTE — Patient Instructions (Signed)
ADD THESE TO CURRENT EXERCISES:  Habituation - Tip Card  1.The goal of habituation training is to assist in decreasing symptoms of vertigo, dizziness, or nausea provoked by specific head and body motions. 2.These exercises may initially increase symptoms; however, be persistent and work through symptoms. With repetition and time, the exercises will assist in reducing or eliminating symptoms. 3.Exercises should be stopped and discussed with the therapist if you experience any of the following: - Sudden change or fluctuation in hearing - New onset of ringing in the ears, or increase in current intensity - Any fluid discharge from the ear - Severe pain in neck or back - Extreme nausea  Copyright  VHI. All rights reserved.   Habituation - Rolling   With pillow under head, start on back. Roll to your right side.  Hold until dizziness stops, plus 20 seconds and then roll to the left side.  Hold until dizziness stops, plus 20 seconds.  Repeat sequence 5 times per session. Do 2 sessions per day.  Copyright  VHI. All rights reserved.          Habituation - Sit to Side-Lying   Sit on edge of bed. Lie down onto the right side and hold until dizziness stops, plus 20 seconds.  Return to sitting and wait until dizziness stops, plus 20 seconds.  Repeat to the left side. Repeat sequence 5 times per session. Do 2 sessions per day.  Copyright  VHI. All rights reserved.

## 2016-05-28 NOTE — Patient Instructions (Addendum)
Please follow-up with me in 4 months for an office visit with fasting labs. I want to see you in the morning with nothing to eat or drink since midnight the night prior.   IF you received an x-ray today, you will receive an invoice from [facility]. Please contact [facility] at [phone] with questions or concerns regarding your invoice.   IF you received labwork today, you will receive an invoice from [facility]. Please contact [facility] at [phone] with questions or concerns regarding your invoice.   Our billing staff will not be able to assist you with questions regarding bills from these companies.  You will be contacted with the lab results as soon as they are available. The fastest way to get your results is to activate your My Chart account. Instructions are located on the last page of this paperwork. If you have not heard from us regarding the results in 2 weeks, please contact this office.     Managing Your Hypertension Hypertension is commonly called high blood pressure. This is when the force of your blood pressing against the walls of your arteries is too strong. Arteries are blood vessels that carry blood from your heart throughout your body. Hypertension forces the heart to work harder to pump blood, and may cause the arteries to become narrow or stiff. Having untreated or uncontrolled hypertension can cause heart attack, stroke, kidney disease, and other problems. What are blood pressure readings? A blood pressure reading consists of a higher number over a lower number. Ideally, your blood pressure should be below 120/80. The first ("top") number is called the systolic pressure. It is a measure of the pressure in your arteries as your heart beats. The second ("bottom") number is called the diastolic pressure. It is a measure of the pressure in your arteries as the heart relaxes. What does my blood pressure reading mean? Blood pressure is classified into four  stages. Based on your blood pressure reading, your health care provider may use the following stages to determine what type of treatment you need, if any. Systolic pressure and diastolic pressure are measured in a unit called mm Hg. Normal   Systolic pressure: below 120.  Diastolic pressure: below 80. Elevated   Systolic pressure: 120-129.  Diastolic pressure: below 80. Hypertension stage 1     Diastolic pressure: 80-89. Hypertension stage 2   Systolic pressure: 140 or above.  Diastolic pressure: 90 or above. What health risks are associated with hypertension? Managing your hypertension is an important responsibility. Uncontrolled hypertension can lead to:  A heart attack.  A stroke.  A weakened blood vessel (aneurysm).  Heart failure.  Kidney damage.  Eye damage.  Metabolic syndrome.  Memory and concentration problems. What changes can I make to manage my hypertension? Eating and drinking   Eat a diet that is high in fiber and potassium, and low in salt (sodium), added sugar, and fat. An example eating plan is called the DASH (Dietary Approaches to Stop Hypertension) diet. To eat this way:  Eat plenty of fresh fruits and vegetables. Try to fill half of your plate at each meal with fruits and vegetables.  Eat whole grains, such as whole wheat pasta, brown rice, or whole grain bread. Fill about one quarter of your plate with whole grains.  Eat low-fat diary products.  Avoid fatty cuts of meat, processed or cured meats, and poultry with skin. Fill about one quarter of your plate with lean proteins such as fish, chicken without skin, beans,  eggs, and tofu.  Avoid premade and processed foods. These tend to be higher in sodium, added sugar, and fat.     Lifestyle   Work with your health care provider to maintain a healthy body weight, or to lose weight. Ask what an ideal weight is for you.  Get at least 30 minutes of exercise that causes your heart to beat  faster (aerobic exercise) most days of the week. Activities may include walking, swimming, or biking.       Monitoring   Monitor your blood pressure at home as told by your health care provider. Your personal target blood pressure may vary depending on your medical conditions, your age, and other factors.  Have your blood pressure checked regularly, as often as told by your health care provider. Working with your health care provider   Review all the medicines you take with your health care provider because there may be side effects or interactions.  Talk with your health care provider about your diet, exercise habits, and other lifestyle factors that may be contributing to hypertension.  Visit your health care provider regularly. Your health care provider can help you create and adjust your plan for managing hypertension. Will I need medicine to control my blood pressure? Your health care provider may prescribe medicine if lifestyle changes are not enough to get your blood pressure under control, and if:  Your systolic blood pressure is 130 or higher.  Your diastolic blood pressure is 80 or higher. Take medicines only as told by your health care provider. Follow the directions carefully. Blood pressure medicines must be taken as prescribed. The medicine does not work as well when you skip doses. Skipping doses also puts you at risk for problems. Contact a health care provider if:  You think you are having a reaction to medicines you have taken.  You have repeated (recurrent) headaches.  You feel dizzy.  You have swelling in your ankles.  You have trouble with your vision. Get help right away if:  You develop a severe headache or confusion.  You have unusual weakness or numbness, or you feel faint.  You have severe pain in your chest or abdomen.  You vomit repeatedly.  You have trouble breathing. Summary  Hypertension is when the force of blood pumping through your  arteries is too strong. If this condition is not controlled, it may put you at risk for serious complications.  Your personal target blood pressure may vary depending on your medical conditions, your age, and other factors. For most people, a normal blood pressure is less than 120/80.  Hypertension is managed by lifestyle changes, medicines, or both. Lifestyle changes include weight loss, eating a healthy, low-sodium diet, exercising more, and limiting alcohol. This information is not intended to replace advice given to you by your health care provider. Make sure you discuss any questions you have with your health care provider. Document Released: 09/29/2011 Document Revised: 12/03/2015 Document Reviewed: 12/03/2015 Elsevier Interactive Patient Education  2017 [employer].

## 2016-05-29 ENCOUNTER — Encounter: Payer: Self-pay | Admitting: Family Medicine

## 2016-05-29 LAB — BASIC METABOLIC PANEL
BUN/Creatinine Ratio: 18 (ref 9–23)
BUN: 14 mg/dL (ref 6–24)
CALCIUM: 9.5 mg/dL (ref 8.7–10.2)
CO2: 30 mmol/L — AB (ref 18–29)
Chloride: 96 mmol/L (ref 96–106)
Creatinine, Ser: 0.77 mg/dL (ref 0.57–1.00)
GFR calc Af Amer: 102 mL/min/{1.73_m2} (ref >59)
GFR calc non Af Amer: 88 mL/min/{1.73_m2} (ref >59)
Glucose: 93 mg/dL (ref 65–99)
POTASSIUM: 3.8 mmol/L (ref 3.5–5.2)
Sodium: 143 mmol/L (ref 134–144)

## 2016-05-29 NOTE — Telephone Encounter (Signed)
Signed and given to pt at her visit.

## 2016-05-31 ENCOUNTER — Ambulatory Visit: Payer: Managed Care, Other (non HMO) | Admitting: Family Medicine

## 2016-05-31 ENCOUNTER — Telehealth: Payer: Self-pay | Admitting: *Deleted

## 2016-05-31 NOTE — Telephone Encounter (Signed)
Lab letter sent 

## 2016-05-31 NOTE — Telephone Encounter (Signed)
Paperwork scanned into chart and given to patient on her 05/28/16 visit

## 2016-06-01 ENCOUNTER — Encounter: Payer: Self-pay | Admitting: Occupational Therapy

## 2016-06-01 ENCOUNTER — Ambulatory Visit: Payer: 59 | Admitting: Occupational Therapy

## 2016-06-01 ENCOUNTER — Ambulatory Visit: Payer: 59 | Admitting: Physical Therapy

## 2016-06-01 ENCOUNTER — Encounter: Payer: Self-pay | Admitting: Physical Therapy

## 2016-06-01 DIAGNOSIS — R2689 Other abnormalities of gait and mobility: Secondary | ICD-10-CM

## 2016-06-01 DIAGNOSIS — R41842 Visuospatial deficit: Secondary | ICD-10-CM

## 2016-06-01 DIAGNOSIS — R2681 Unsteadiness on feet: Secondary | ICD-10-CM

## 2016-06-01 DIAGNOSIS — R278 Other lack of coordination: Secondary | ICD-10-CM

## 2016-06-01 NOTE — Therapy (Signed)
[facility] [address] [place], [place], [address] Phone: [phone]   Fax:  [phone]  Physical Therapy Treatment  Patient Details  Name: Terri Dickerson MRN: [id number] Date of Birth: 05-18-1960 Referring Provider: Delman Cheadle, MD  Encounter Date: 06/01/2016      PT End of Session - 06/01/16 1238    Visit Number 4   Number of Visits 12   Date for PT Re-Evaluation 07/16/16   Authorization Type private insurance   PT Start Time 1234   PT Stop Time 1315   PT Time Calculation (min) 41 min   Equipment Utilized During Treatment Gait belt   Activity Tolerance Patient tolerated treatment well   Behavior During Therapy [facility] for tasks assessed/performed      Past Medical History:  Diagnosis Date  . Hyperlipidemia   . Hypertension   . Stroke [facility])     History reviewed. No pertinent surgical history.  There were no vitals filed for this visit.      Subjective Assessment - 06/01/16 1237    Subjective No new complaints except for GI issues today. No falls. Had a headache this am, gone now.    Patient Stated Goals Get back on center and get close to 100% if I can.  "Get back to work", "Get back to me".   Currently in Pain? No/denies            [facility] Adult PT Treatment/Exercise - 06/01/16 1258      Transfers   Transfers Sit to Stand;Stand to Sit   Sit to Stand 6: Modified independent (Device/Increase time);With upper extremity assist;From bed;From chair/3-in-1   Stand to Sit 6: Modified independent (Device/Increase time);With upper extremity assist;To bed;To chair/3-in-1     Ambulation/Gait   Ambulation/Gait Yes   Ambulation/Gait Assistance 5: Supervision;4: Min guard   Ambulation/Gait Assistance Details supervision on level surfaces with no issues, min guard assist on unlevel surfaces for safety with gait. pt with mild unsteadiness on unlevel surfaces, no significant balance loss noted with pt  maintaining balance despite unsteadiness.                                        Ambulation Distance (Feet) 440 Feet  x1 indoors; 500 x1 in/outdoors   Assistive device None   Gait Pattern Step-through pattern;Decreased stride length;Decreased trunk rotation;Narrow base of support;Decreased arm swing - right;Decreased arm swing - left   Ambulation Surface Level;Indoor;Unlevel;Outdoor;Paved;Gravel;Grass         Vestibular Treatment/Exercise - 06/01/16 [place]   Number of Reps  3   Symptom Description  symptoms reported with going each way, improved with repetition. cues to decrease extra head motions during reps.                            Balance Exercises - 06/01/16 1307      Balance Exercises: Standing   Standing Eyes Closed Narrow base of support (BOS);Head turns;Foam/compliant surface;Other reps (comment);30 secs;Limitations     Balance Exercises: Standing   Standing Eyes Closed Limitations on 2 pillows in corner with chair in front for safety: narrow base of support with no UE support: EC no head movements, progressing to EC head movements left<>right, up<>down and diagonals both ways x 10 reps each way with min guard to min assist for  balance.                                   PT Short Term Goals - 05/28/16 1006      PT SHORT TERM GOAL #1   Title The patient will be indep with HEP for balance, coordination, and dynamic activities.   Baseline Target date 06/16/2016   Time 4   Period Weeks     PT SHORT TERM GOAL #2   Title The patient will improve Berg from 34/56 to > or equal to 40/56 to demo dec'ing risk for falls.   Baseline Berg 49/56 on 05/28/16   Time 4   Period Weeks   Status Achieved     PT SHORT TERM GOAL #3   Title The patient will improve gait speed from 2.05 ft/sec with RW to > or equal to 2.7 ft/sec with RW modified indep.   Baseline 4.05 ft/sec without a device (improved from 1.73 ft/sec without device at eval!)   Time 4   Period  Weeks   Status Achieved     PT SHORT TERM GOAL #4   Title The patient will negotiate 14 steps with reciprocal pattern and R handrail modified indep.   Baseline Met on 05/28/16   Time 4   Period Weeks   Status Achieved     PT SHORT TERM GOAL #5   Title The patient will ambulate without device x 250 feet modified indep (slower pace, intermittent UE support) to demo ability to negotiate household surfaces without a device.   Baseline Met on 05/28/16   Time 4   Period Weeks   Status Achieved           PT Long Term Goals - 05/17/16 1510      PT LONG TERM GOAL #1   Title The patient will improve neuro QOL by 12% (baseline is 34.3%)   Baseline Target date 07/17/2016   Time 8   Period Weeks     PT LONG TERM GOAL #2   Title The patient will improve Berg from 34/56 to > or equal to 44/56 to demo dec'd risk for falls.   Baseline Target date 07/17/2016   Time 8   Period Weeks     PT LONG TERM GOAL #3   Title The patient will negotiate 4 steps without rails with modified indep for community mobility.   Baseline Target date 07/17/2016   Time 8   Period Weeks     PT LONG TERM GOAL #4   Title The patient will improve gait speed without device from 1.73 ft/sec to > or equal to 2.4 ft/sec to demo dec'ing dependence on RW.   Baseline Target date 07/17/2016   Time 8   Period Weeks     PT LONG TERM GOAL #5   Title The patient will negotiate community surfaces x 1000 ft without a device modified indep.   Baseline Target date 07/17/2016   Time 8   Period Weeks           Plan - 06/01/16 1240    Clinical Impression Statement Today's skilled session continued to address gait and balance with no issues reported. Pt is progressing towards goals and should benefit from continued PT to progress toward's unmet goals.    PT Treatment/Interventions ADLs/Self Care Home Management;Therapeutic activities;Therapeutic exercise;Balance training;Neuromuscular re-education;Gait training;Functional  mobility training;Patient/family education;Vestibular   PT Next Visit Plan standing balance  activities to progress for home, multi-sensory training, L LE coordination. Work on turns, foam with eyes closed for home.   Consulted and Agree with Plan of Care Patient      Patient will benefit from skilled therapeutic intervention in order to improve the following deficits and impairments:  Abnormal gait, Decreased activity tolerance, Decreased balance, Decreased mobility, Postural dysfunction, Dizziness, Difficulty walking  Visit Diagnosis: Other abnormalities of gait and mobility  Unsteadiness on feet     Problem List Patient Active Problem List   Diagnosis Date Noted  . History of cerebellar stroke 05/28/2016  . 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

## 2016-06-01 NOTE — Therapy (Signed)
[facility] [address] [place], [place], [address] Phone: [phone]   Fax:  [phone]  Occupational Therapy Treatment  Patient Details  Name: Terri Dickerson MRN: [id number] Date of Birth: [birth year] Referring Provider: Dr. [name]  Encounter Date: 06/01/2016      OT End of Session - 06/01/16 1710    Visit Number 3   Number of Visits 9   Date for OT Re-Evaluation 06/14/16   Authorization Type [employer] - 60 visits combined   Authorization - Visit Number 3   Authorization - Number of Visits 30   OT Start Time 1318   OT Stop Time 1400   OT Time Calculation (min) 42 min   Activity Tolerance Patient tolerated treatment well      Past Medical History:  Diagnosis Date  . Hyperlipidemia   . Hypertension   . Stroke [facility])     History reviewed. No pertinent surgical history.  There were no vitals filed for this visit.      Subjective Assessment - 06/01/16 1400    Subjective  My stomach gets upset some days but on a good day I can shower and dress in about 30 minutes.   Patient is accompained by: Family member  dtr   Pertinent History see epic pt with cerebellar stroke   Currently in Pain? No/denies                      OT Treatments/Exercises (OP) - 06/01/16 1401      ADLs   Leisure Reviewed status of current goals - see goal update for details. Pt and dtr report that pt is now showering and dressing mod I as well as shower transfers mod I.  Pt reports that she feels her LUE is working better '" I feel like I can control  my left arm and hand better."  Pt also reports she has done some limited cooking at home with dtr supervising.  Pt has not yet returned to doing household chores.       Fine Motor Coordination   Fine Motor Coordination --   Other Fine Motor Exercises Addressed fine motor control and in hand manipulation with two items and with adjusting small items in hand. Pt with  minimal difficulty.                      OT Long Term Goals - 06/01/16 1709      OT LONG TERM GOAL #1   Title Pt will be mod I with HEP- 06/14/2016   Status On-going     OT LONG TERM GOAL #2   Title Pt will demonstrate improved coordination in LUE as evidenced by ecreased time on 9 hole peg by at least 5 seconds (baseline= 32.13)   Status On-going     OT LONG TERM GOAL #3   Title Pt will be mod I with basic ADL's (bathing, dressing)   Status Achieved     OT LONG TERM GOAL #4   Title Pt will be mod I with shower transfers   Status Achieved     OT LONG TERM GOAL #5   Title Pt will be mod I with simple hot meal prep   Status On-going     OT LONG TERM GOAL #6   Title Pt will report return to basic home mgmt tasks including dishes, making bed, doing laundry   Status On-going     OT LONG  TERM GOAL #7   Title Pt will be able to cut piece of meat on plate when eating   Status Achieved               Plan - 06/01/16 1709    Clinical Impression Statement Pt progressing toward goals.  Pt has made good progress toward independence in basic self care as well as in LUE functional use.   Rehab Potential Good   OT Frequency 2x / week   OT Duration 4 weeks   OT Treatment/Interventions Self-care/ADL training;Neuromuscular education;Therapeutic exercise;Energy conservation;DME and/or AE instruction;Building services engineerunctional Mobility Training;Therapeutic activities;Visual/perceptual remediation/compensation;Patient/family education;Balance training   Plan address home mgmt tasks, balance, activity tolerance, functional mobility   Consulted and Agree with Plan of Care Patient;Family member/caregiver   Family Member Consulted daughter      Patient will benefit from skilled therapeutic intervention in order to improve the following deficits and impairments:  Decreased activity tolerance, Decreased balance, Decreased coordination, Decreased mobility, Decreased knowledge of use of DME,  Difficulty walking, Impaired UE functional use, Impaired vision/preception  Visit Diagnosis: Unsteadiness on feet  Other lack of coordination  Visuospatial deficit    Problem List Patient Active Problem List   Diagnosis Date Noted  . History of cerebellar stroke 05/28/2016  . 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

## 2016-06-03 ENCOUNTER — Ambulatory Visit: Payer: 59 | Admitting: Occupational Therapy

## 2016-06-03 ENCOUNTER — Ambulatory Visit: Payer: 59 | Admitting: Rehabilitative and Restorative Service Providers"

## 2016-06-03 DIAGNOSIS — R2689 Other abnormalities of gait and mobility: Secondary | ICD-10-CM

## 2016-06-03 DIAGNOSIS — R2681 Unsteadiness on feet: Secondary | ICD-10-CM | POA: Diagnosis not present

## 2016-06-03 DIAGNOSIS — R278 Other lack of coordination: Secondary | ICD-10-CM

## 2016-06-03 NOTE — Therapy (Signed)
[facility] [address] [place], [place], [address] Phone: [phone]   Fax:  [phone]  Physical Therapy Treatment  Patient Details  Name: Terri Dickerson MRN: [id number] Date of Birth: January 21, 1960 Referring Provider: Delman Cheadle, MD  Encounter Date: 06/03/2016      PT End of Session - 06/03/16 1036    Visit Number 5   Number of Visits 12   Date for PT Re-Evaluation 07/16/16   Authorization Type private insurance   PT Start Time 3875   PT Stop Time 1103   PT Time Calculation (min) 40 min   Equipment Utilized During Treatment Gait belt   Activity Tolerance Patient tolerated treatment well   Behavior During Therapy [facility] for tasks assessed/performed      Past Medical History:  Diagnosis Date  . Hyperlipidemia   . Hypertension   . Stroke [facility])     No past surgical history on file.  There were no vitals filed for this visit.      Subjective Assessment - 06/03/16 1025    Subjective The patient did not eat a lot before today's session.     Patient Stated Goals Get back on center and get close to 100% if I can.  "Get back to work", "Get back to me".   Currently in Pain? No/denies                         [facility] Adult PT Treatment/Exercise - 06/03/16 1053      Ambulation/Gait   Ambulation/Gait Yes   Ambulation/Gait Assistance 5: Supervision;6: Modified independent (Device/Increase time)   Ambulation/Gait Assistance Details Outdoors with supervision, modified indep on indoor surfaces.   Ambulation Distance (Feet) 800 Feet   Assistive device None   Ambulation Surface Level;Unlevel;Indoor;Outdoor;Paved;Grass  and rubber mulch   Gait Comments figure 8 turns, ball toss with walking, passing a ball side to side with walking all with close supervision to CGA.  Heel/toe walking with supervision.       Neuro Re-ed    Neuro Re-ed Details  Corner balance standing on foam with eyes closed.   Marching with cues to lift LEs x 40 feet with supervision. 180 degree and 360 degree turns with supervision without dizziness reported.                  PT Education - 06/03/16 1232    Education provided Yes   Education Details Increasing walking distance   Person(s) Educated Patient;Caregiver(s)   Methods Explanation;Demonstration;Handout   Comprehension Verbalized understanding;Returned demonstration          PT Short Term Goals - 05/28/16 1006      PT SHORT TERM GOAL #1   Title The patient will be indep with HEP for balance, coordination, and dynamic activities.   Baseline Target date 06/16/2016   Time 4   Period Weeks     PT SHORT TERM GOAL #2   Title The patient will improve Berg from 34/56 to > or equal to 40/56 to demo dec'ing risk for falls.   Baseline Berg 49/56 on 05/28/16   Time 4   Period Weeks   Status Achieved     PT SHORT TERM GOAL #3   Title The patient will improve gait speed from 2.05 ft/sec with RW to > or equal to 2.7 ft/sec with RW modified indep.   Baseline 4.05 ft/sec without a device (improved from 1.73 ft/sec without device at eval!)  Time 4   Period Weeks   Status Achieved     PT SHORT TERM GOAL #4   Title The patient will negotiate 14 steps with reciprocal pattern and R handrail modified indep.   Baseline Met on 05/28/16   Time 4   Period Weeks   Status Achieved     PT SHORT TERM GOAL #5   Title The patient will ambulate without device x 250 feet modified indep (slower pace, intermittent UE support) to demo ability to negotiate household surfaces without a device.   Baseline Met on 05/28/16   Time 4   Period Weeks   Status Achieved           PT Long Term Goals - 06/03/16 1042      PT LONG TERM GOAL #1   Title The patient will improve neuro QOL by 12% (baseline is 34.3%)   Baseline Target date 07/17/2016   Time 8   Period Weeks     PT LONG TERM GOAL #2   Title The patient will improve Berg from 34/56 to > or equal to 44/56  to demo dec'd risk for falls.   Baseline Met per STG *see above   Time 8   Period Weeks   Status Achieved     PT LONG TERM GOAL #3   Title The patient will negotiate 4 steps without rails with modified indep for community mobility.   Baseline Target date 07/17/2016   Time 8   Period Weeks     PT LONG TERM GOAL #4   Title The patient will improve gait speed without device from 1.73 ft/sec to > or equal to 2.4 ft/sec to demo dec'ing dependence on RW.   Baseline Met per STG *see above.   Time 8   Period Weeks   Status Achieved     PT LONG TERM GOAL #5   Title The patient will negotiate community surfaces x 1000 ft without a device modified indep.   Baseline Target date 07/17/2016   Time 8   Period Weeks               Plan - 06/03/16 1232    Clinical Impression Statement The patient is making continued progress working towards [place].  PT recommended she slow pace down and start ambulating on community surfaces without RW.  Patient to begin walking for exercise with family supervision to progress to independent community mobility.   PT Treatment/Interventions ADLs/Self Care Home Management;Therapeutic activities;Therapeutic exercise;Balance training;Neuromuscular re-education;Gait training;Functional mobility training;Patient/family education;Vestibular   PT Next Visit Plan Multi-sensory balance training, dynamic gait/ multi-tasking, progress HEP for greater challenge as needed.   Consulted and Agree with Plan of Care Patient;Family member/caregiver   Family Member Consulted daughter      Patient will benefit from skilled therapeutic intervention in order to improve the following deficits and impairments:  Abnormal gait, Decreased activity tolerance, Decreased balance, Decreased mobility, Postural dysfunction, Dizziness, Difficulty walking  Visit Diagnosis: Unsteadiness on feet  Other abnormalities of gait and mobility     Problem List Patient Active Problem  List   Diagnosis Date Noted  . History of cerebellar stroke 05/28/2016  . 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

## 2016-06-03 NOTE — Therapy (Signed)
[facility] [address] [place], [place], [address] Phone: [phone]   Fax:  [phone]  Occupational Therapy Treatment  Patient Details  Name: Terri Dickerson MRN: [id number] Date of Birth: Oct 05, 1960 Referring Provider: Dr. Norberto SorensonEva Shaw  Encounter Date: 06/03/2016      OT End of Session - 06/03/16 1207    Visit Number 4   Number of Visits 9   Date for OT Re-Evaluation 06/14/16   Authorization Type [employer] - 60 visits combined   Authorization - Visit Number 4   Authorization - Number of Visits 30   OT Start Time 1100   OT Stop Time 1145   OT Time Calculation (min) 45 min   Activity Tolerance Patient tolerated treatment well      Past Medical History:  Diagnosis Date  . Hyperlipidemia   . Hypertension   . Stroke [facility](HCC)     No past surgical history on file.  There were no vitals filed for this visit.      Subjective Assessment - 06/03/16 1107    Patient is accompained by: Family member   Pertinent History see epic pt with cerebellar stroke   Currently in Pain? No/denies                      OT Treatments/Exercises (OP) - 06/03/16 0001      ADLs   Functional Mobility Practiced getting things off floor. Practiced getting things in/out of low cabinets. Practiced stepping up/down ramp and off curb with laundry basket. All tasks w/o LOB however did provided task modifications/adaptations if pt did feel insecure or off balance   Home Maintenance Pt simulated laundry task (practiced getting in/out of washer and dryer) then carried laundry basket and folded clothes. Pt also swept floor and swept up into pan (with task modifications prn for balance). Pt also simulated making bed      Fine Motor Coordination   Other Fine Motor Exercises Reviewed coordination HEP                      OT Long Term Goals - 06/01/16 1709      OT LONG TERM GOAL #1   Title Pt will be mod I with HEP-  06/14/2016   Status On-going     OT LONG TERM GOAL #2   Title Pt will demonstrate improved coordination in LUE as evidenced by ecreased time on 9 hole peg by at least 5 seconds (baseline= 32.13)   Status On-going     OT LONG TERM GOAL #3   Title Pt will be mod I with basic ADL's (bathing, dressing)   Status Achieved     OT LONG TERM GOAL #4   Title Pt will be mod I with shower transfers   Status Achieved     OT LONG TERM GOAL #5   Title Pt will be mod I with simple hot meal prep   Status On-going     OT LONG TERM GOAL #6   Title Pt will report return to basic home mgmt tasks including dishes, making bed, doing laundry   Status On-going     OT LONG TERM GOAL #7   Title Pt will be able to cut piece of meat on plate when eating   Status Achieved               Plan - 06/03/16 1208    Clinical Impression Statement Pt making progress towards  all goals. Pt performing home maintenance tasks in clinic without difficulty. Encouraged pt to begin doing at home   Rehab Potential Good   OT Frequency 2x / week   OT Duration 4 weeks   OT Treatment/Interventions Self-care/ADL training;Neuromuscular education;Therapeutic exercise;Energy conservation;DME and/or AE instruction;Building services engineer;Therapeutic activities;Visual/perceptual remediation/compensation;Patient/family education;Balance training   Plan Pt to practice doing laundry, sweeping, making bed, washing dishes, and continue simple cooking at home. Begin checking remaining LTG's next session   Consulted and Agree with Plan of Care Patient;Family member/caregiver   Family Member Consulted daughter      Patient will benefit from skilled therapeutic intervention in order to improve the following deficits and impairments:  Decreased activity tolerance, Decreased balance, Decreased coordination, Decreased mobility, Decreased knowledge of use of DME, Difficulty walking, Impaired UE functional use, Impaired  vision/preception  Visit Diagnosis: Other lack of coordination  Unsteadiness on feet    Problem List Patient Active Problem List   Diagnosis Date Noted  . History of cerebellar stroke 05/28/2016  . 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

## 2016-06-05 ENCOUNTER — Other Ambulatory Visit: Payer: Self-pay | Admitting: Family Medicine

## 2016-06-07 ENCOUNTER — Ambulatory Visit: Payer: 59 | Admitting: Occupational Therapy

## 2016-06-07 ENCOUNTER — Ambulatory Visit: Payer: 59 | Admitting: Rehabilitative and Restorative Service Providers"

## 2016-06-07 DIAGNOSIS — R2689 Other abnormalities of gait and mobility: Secondary | ICD-10-CM

## 2016-06-07 DIAGNOSIS — R2681 Unsteadiness on feet: Secondary | ICD-10-CM | POA: Diagnosis not present

## 2016-06-07 NOTE — Patient Instructions (Signed)
Gaze Stabilization: Tip Card  1.Target must remain in focus, not blurry, and appear stationary while head is in motion.  2.Perform exercises with small head movements (45 to either side of midline).  3.Increase speed of head motion so long as target is in focus.  4.If you wear eyeglasses, be sure you can see target through lens (therapist will give specific instructions for bifocal / progressive lenses).  5.These exercises may provoke dizziness or nausea. Work through these symptoms. If too dizzy, slow head movement slightly. Rest between each exercise.  6.Exercises demand concentration; avoid distractions.  7.For safety, perform standing exercises close to a counter, wall, corner, or next to someone.- HOLD RW for now.   Copyright  VHI. All rights reserved.     Gaze Stabilization: Standing Feet Apart    Feet shoulder width apart, keeping eyes on target on wall __5-6__ feet away, tilt head down 15-30 and move head side to side for _20_ seconds. Repeat while moving head up and down for _20__ seconds. Do __1-2__ sessions per day.  Copyright  VHI. All rights reserved.   Perform these in a corner with a chair in front of you for safety:     Feet Together, Varied Arm Positions - Eyes Closed  Feet Apart (Compliant Surface) Varied Arm Positions - Eyes Closed    Stand on compliant surface: pillow with feet shoulder width apart and arms AT YOUR SIDES.  Close eyes and visualize upright position. Hold__30__ seconds. Repeat __3__ times per session. Do ____ sessions per day.  Copyright  VHI. All rights reserved.   Feet Apart, Head Motion - Eyes Closed    With eyes closed and feet shoulder width apart, move head slowly: 1. Up and down x 10 reps 2. Left and right x 10 reps 3. Diagonals both ways x 10 reps Do _1-2_ sessions per day.  Copyright  VHI. All rights reserved.    Functional Quadriceps: Sit to Stand    Sit on edge of chair, feet flat on floor.  Stand upright, extending knees fully. Repeat _10_ times per set. Do _1_ sets per session. Do __1-2 sessions per day.  http://orth.exer.us/735   Copyright  VHI. All rights reserved.   ADD THESE TO CURRENT EXERCISES:  Habituation - Tip Card  1.The goal of habituation training is to assist in decreasing symptoms of vertigo, dizziness, or nausea provoked by specific head and body motions. 2.These exercises may initially increase symptoms; however, be persistent and work through symptoms. With repetition and time, the exercises will assist in reducing or eliminating symptoms. 3.Exercises should be stopped and discussed with the therapist if you experience any of the following: - Sudden change or fluctuation in hearing - New onset of ringing in the ears, or increase in current intensity - Any fluid discharge from the ear - Severe pain in neck or back - Extreme nausea  Copyright  VHI. All rights reserved.   Habituation - Rolling  You can rest after each movement to allow all nausea to settle.  Then move to next position.   With pillow under head, start on back. Roll to your right side.  Hold until dizziness stops, plus 20 seconds and then roll to the left side.  Hold until dizziness stops, plus 20 seconds.  Repeat sequence 5 times per session. Do 2 sessions per day.  Copyright  VHI. All rights reserved.          Habituation - Sit to Side-Lying   Sit on edge of bed. Lorenz Coaster  down onto the right side and hold until dizziness stops, plus 20 seconds.  Return to sitting and wait until dizziness stops, plus 20 seconds.  Repeat to the left side. Repeat sequence 5 times per session. Do 2 sessions per day.  Copyright  VHI. All rights reserved.        Electronically signed by Berneice HeinrichWeaver, Jessaca Philippi M, PT at 05/28/2016 9:58 AM

## 2016-06-07 NOTE — Therapy (Signed)
[facility] [address] [place], [place], [address] Phone: [phone]   Fax:  [phone]  Physical Therapy Treatment  Patient Details  Name: Terri Dickerson MRN: [id number] Date of Birth: [birth year] Referring Provider: Delman Cheadle, MD  Encounter Date: 06/07/2016      PT End of Session - 06/07/16 1031    Visit Number 6   Number of Visits 12   Date for PT Re-Evaluation 07/16/16   Authorization Type private insurance   PT Start Time 1024   PT Stop Time 1104   PT Time Calculation (min) 40 min   Equipment Utilized During Treatment Gait belt   Activity Tolerance Patient tolerated treatment well   Behavior During Therapy [facility] for tasks assessed/performed      Past Medical History:  Diagnosis Date  . Hyperlipidemia   . Hypertension   . Stroke [facility])     No past surgical history on file.  There were no vitals filed for this visit.      Subjective Assessment - 06/07/16 1029    Subjective The patient is walking without RW in community, but at slowed/more cautious pace.     Patient Stated Goals Get back on center and get close to 100% if I can.  "Get back to work", "Get back to me".   Currently in Pain? No/denies                         [facility] Adult PT Treatment/Exercise - 06/07/16 1046      Ambulation/Gait   Ambulation/Gait Yes   Ambulation/Gait Assistance 6: Modified independent (Device/Increase time)   Ambulation/Gait Assistance Details outdoors on grassy surfaces, hills, paved parking lot, curbs, gravel   Ambulation Distance (Feet) 1000 Feet   Assistive device None   Ambulation Surface Level;Unlevel;Indoor;Outdoor;Paved;Gravel;Grass  hills     Self-Care   Self-Care Other Self-Care Comments   Other Self-Care Comments  PT discussed our progression and emphasized need for regular movement to patient and to her daughter.  Patient is not a high fall risk per testing and shows good safety  awareness on grass and community surfaces.  She notes her daughter recommends she not pick objects up from the floor at home and limit return to household chores.  We discussed having supervision (for safety precaution) when trying new tasks such as rolling full trash can to the curb on gravel, but once she demonstrates ability to perform, she can return to that task.  The patient and her daughter express being nervous without the walker when in the community.  We discussed strategies to continue improving comfort including regular walking.     Neuro Re-ed    Neuro Re-ed Details  Corner balance activities standing on thick foam with CGA with eyes closed + feet together.  Performed with feet apart on pillow + eyes closed and modified HEP to include (progressed from eyes closed on solid surfaces).           Vestibular Treatment/Exercise - 06/07/16 1048      Vestibular Treatment/Exercise   Vestibular Treatment Provided Habituation;Gaze   Habituation Exercises Horizontal Roll   Gaze Exercises X1 Viewing Horizontal     Nestor Lewandowsky   Symptom Description  discussed slowing speed and allowing symptoms to settle between repetitions to avoid nausea when completing for HEP.  Demonstrated this today in therapy using horizontal roll as example.     Horizontal Roll   Number of Reps  4  Symptom Description  discussed slowing repetitions down and settling before moving again     X1 Viewing Horizontal   Foot Position standing   Comments x 30 seconds x 3 reps with cues for faster movement while maintaining gaze.  Patient notes some provocation of mild symptoms with this activity.                   PT Short Term Goals - 05/28/16 1006      PT SHORT TERM GOAL #1   Title The patient will be indep with HEP for balance, coordination, and dynamic activities.   Baseline Target date 06/16/2016   Time 4   Period Weeks     PT SHORT TERM GOAL #2   Title The patient will improve Berg from 34/56 to >  or equal to 40/56 to demo dec'ing risk for falls.   Baseline Berg 49/56 on 05/28/16   Time 4   Period Weeks   Status Achieved     PT SHORT TERM GOAL #3   Title The patient will improve gait speed from 2.05 ft/sec with RW to > or equal to 2.7 ft/sec with RW modified indep.   Baseline 4.05 ft/sec without a device (improved from 1.73 ft/sec without device at eval!)   Time 4   Period Weeks   Status Achieved     PT SHORT TERM GOAL #4   Title The patient will negotiate 14 steps with reciprocal pattern and R handrail modified indep.   Baseline Met on 05/28/16   Time 4   Period Weeks   Status Achieved     PT SHORT TERM GOAL #5   Title The patient will ambulate without device x 250 feet modified indep (slower pace, intermittent UE support) to demo ability to negotiate household surfaces without a device.   Baseline Met on 05/28/16   Time 4   Period Weeks   Status Achieved           PT Long Term Goals - 06/07/16 1727      PT LONG TERM GOAL #1   Title The patient will improve neuro QOL by 12% (baseline is 34.3%)   Baseline Target date 07/17/2016   Time 8   Period Weeks     PT LONG TERM GOAL #2   Title The patient will improve Berg from 34/56 to > or equal to 44/56 to demo dec'd risk for falls.   Baseline Met per STG *see above   Time 8   Period Weeks   Status Achieved     PT LONG TERM GOAL #3   Title The patient will negotiate 4 steps without rails with modified indep for community mobility.   Baseline Target date 07/17/2016   Time 8   Period Weeks   Status On-going     PT LONG TERM GOAL #4   Title The patient will improve gait speed without device from 1.73 ft/sec to > or equal to 2.4 ft/sec to demo dec'ing dependence on RW.   Baseline Met per STG *see above.   Time 8   Period Weeks   Status Achieved     PT LONG TERM GOAL #5   Title The patient will negotiate community surfaces x 1000 ft without a device modified indep.   Baseline Met on 06/07/2016   Time 8   Period  Weeks   Status Achieved     PT LONG TERM GOAL #6   Title The patient will improve Berg from [phone]  up to > or equal to 53/56 to demo improving high level balance.   Baseline Target date 07/17/2016   Time 4   Period Weeks   Status New     PT LONG TERM GOAL #7   Title The patient will return to independent community mobility and report confidence to bus stop independently.   Baseline Target date 07/17/3016   Time 4   Period Weeks   Status New               Plan - 06/07/16 1727    Clinical Impression Statement The patient met another LTG ahead of target date.  PT added 2 new LTGS to continue to progress patient to more independence with community mobility.  PT to focus on multi-sensory balance training, dynamic gait, community ambulation and reducing patient's fear of falls when in community.     Rehab Potential Good   PT Treatment/Interventions ADLs/Self Care Home Management;Therapeutic activities;Therapeutic exercise;Balance training;Neuromuscular re-education;Gait training;Functional mobility training;Patient/family education;Vestibular   PT Next Visit Plan PT to reduce to 1x/week next week (anticipate possible early d/c); emphasize multi-sensory training, multi-tasking, dynamic gait, obstacle negotiation, turns, community gait.   Consulted and Agree with Plan of Care Patient;Family member/caregiver   Family Member Consulted daughter      Patient will benefit from skilled therapeutic intervention in order to improve the following deficits and impairments:  Abnormal gait, Decreased activity tolerance, Decreased balance, Decreased mobility, Postural dysfunction, Dizziness, Difficulty walking  Visit Diagnosis: Unsteadiness on feet  Other abnormalities of gait and mobility     Problem List Patient Active Problem List   Diagnosis Date Noted  . History of cerebellar stroke 05/28/2016  . 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 [place], [place], [address] Phone: [phone]   Fax:  [phone]  Name: Terri Dickerson MRN: [id number] Date of Birth: 1960-04-24

## 2016-06-10 ENCOUNTER — Encounter: Payer: Self-pay | Admitting: Occupational Therapy

## 2016-06-10 ENCOUNTER — Encounter: Payer: Self-pay | Admitting: Rehabilitation

## 2016-06-10 ENCOUNTER — Ambulatory Visit: Payer: 59 | Admitting: Rehabilitation

## 2016-06-10 ENCOUNTER — Ambulatory Visit: Payer: 59 | Admitting: Occupational Therapy

## 2016-06-10 DIAGNOSIS — R2689 Other abnormalities of gait and mobility: Secondary | ICD-10-CM

## 2016-06-10 DIAGNOSIS — R41842 Visuospatial deficit: Secondary | ICD-10-CM

## 2016-06-10 DIAGNOSIS — R2681 Unsteadiness on feet: Secondary | ICD-10-CM

## 2016-06-10 DIAGNOSIS — R278 Other lack of coordination: Secondary | ICD-10-CM

## 2016-06-10 NOTE — Therapy (Signed)
[facility] [address] [place], [place], [address] Phone: [phone]   Fax:  [phone]  Occupational Therapy Treatment  Patient Details  Name: Terri Dickerson MRN: [id number] Date of Birth: 26-May-1960 Referring Provider: Dr. Delman Cheadle  Encounter Date: 06/10/2016      OT End of Session - 06/10/16 1013    Visit Number 5   Number of Visits 9   Date for OT Re-Evaluation 06/14/16   Authorization Type [employer] - 10 visits combined   Authorization - Visit Number 5   Authorization - Number of Visits 30   OT Start Time 0931   OT Stop Time 1008   OT Time Calculation (min) 37 min   Activity Tolerance Patient tolerated treatment well      Past Medical History:  Diagnosis Date  . Hyperlipidemia   . Hypertension   . Stroke [facility])     History reviewed. No pertinent surgical history.  There were no vitals filed for this visit.      Subjective Assessment - 06/10/16 0937    Subjective  I went to the grocery store (my dtr drove) and I cooked dinner   Patient is accompained by: Family member  dtr   Pertinent History see epic pt with cerebellar stroke   Currently in Pain? No/denies                      OT Treatments/Exercises (OP) - 06/10/16 0001      ADLs   Leisure Checked remaining goals - see goals for update. Discussed return to driving, obtainng medical clearance and graduated program to return to driving see pt instruction for details.  Also educated pt and dtr on warning signs and symptoms as well as risk factors for stroke. Pt and dtr verbalized understanding of all information.  Information provided in wriitng as well.                 OT Education - 06/10/16 1011    Education provided Yes   Education Details warning signs/symptoms of CVA, graduated return to driving   Person(s) Educated Patient;Child(ren)   Methods Explanation;Handout   Comprehension Verbalized understanding              OT Long Term Goals - 06/10/16 1012      OT LONG TERM GOAL #1   Title Pt will be mod I with HEP- 06/14/2016   Status Achieved     OT LONG TERM GOAL #2   Title Pt will demonstrate improved coordination in LUE as evidenced by ecreased time on 9 hole peg by at least 5 seconds (baseline= 32.13)   Status Achieved  22.93     OT LONG TERM GOAL #3   Title Pt will be mod I with basic ADL's (bathing, dressing)   Status Achieved     OT LONG TERM GOAL #4   Title Pt will be mod I with shower transfers   Status Achieved     OT LONG TERM GOAL #5   Title Pt will be mod I with simple hot meal prep   Status Achieved     OT LONG TERM GOAL #6   Title Pt will report return to basic home mgmt tasks including dishes, making bed, doing laundry   Status Achieved     OT LONG TERM GOAL #7   Title Pt will be able to cut piece of meat on plate when eating   Status Achieved  Plan - 06/10/16 1012    Clinical Impression Statement Pt has made excellent progress and has met all LTG's. Pt reports she is doing home mgmt tasks at home, went grocery shopping and cooked dinner last night   Rehab Potential Good   OT Frequency 2x / week   OT Duration 4 weeks   OT Treatment/Interventions Self-care/ADL training;Neuromuscular education;Therapeutic exercise;Energy conservation;DME and/or AE instruction;Therapist, nutritional;Therapeutic activities;Visual/perceptual remediation/compensation;Patient/family education;Balance training   Plan d/c form OT   Consulted and Agree with Plan of Care Patient;Family member/caregiver   Family Member Consulted daughter      Patient will benefit from skilled therapeutic intervention in order to improve the following deficits and impairments:  Decreased activity tolerance, Decreased balance, Decreased coordination, Decreased mobility, Decreased knowledge of use of DME, Difficulty walking, Impaired UE functional use, Impaired  vision/preception  Visit Diagnosis: Unsteadiness on feet  Other lack of coordination  Visuospatial deficit    Problem List Patient Active Problem List   Diagnosis Date Noted  . History of cerebellar stroke 05/28/2016  . 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  Patient goals were met. Patient is being discharged due to meeting the stated rehab goals.  ?????       Quay Burow, OTR/L 06/10/2016, 10:14 AM  [facility] [address] [place], [place], [address] Phone: [phone]   Fax:  [phone]  Name: Terri Dickerson MRN: [id number] Date of Birth: July 23, 1960

## 2016-06-10 NOTE — Patient Instructions (Signed)
Return to driving:  First you need to medical clearance to drive.  You can get this either from your primary MD or your neurologist.  This means your doctor feels that you are medically stable to return to driving.  Tell them you will be doing a supervised graduated return to driving.  Ask them to document this in your medical record.  Supervised graduated return to driving: 1. Start in an empty parking lot with supervision (i.e. A school parking lot on a Sunday afternoon).  Make sure a licensed adult is in the car with you! 2. Once this feel comfortable, you can graduate to familiar neighborhood streets with supervision during NON busy times. 3. Then graduate to familiar neighborhood streets with supervision during busy times. 4. Now you can drive in your neighborhood short distances without supervsion. 5. Graduate to city streets with supervision during non busy times, then during busy times. 6. Begin to do short familiar trips by yourself. Stay below 45 miles per hour. 7.  Once this feels comfortable you can drive on [address], [address], etc first with supervision then by yourself. Start with non busy times and progress to busier times. 8.  Now you are ready to drive on [address], first with supervision and during non busy times graduating to busier times with supervision and then finally driving highways on your own.  Both you and whom ever is supervising you need to comfortable with moving to the next step. I do not think you need a formal driving eval - they are very expensive and not covered by insurance. Clinically I do not think it is warranted.  Take your time and be safe!!!

## 2016-06-10 NOTE — Therapy (Signed)
[facility] [address] [place], [place], [address] Phone: [phone]   Fax:  [phone]  Physical Therapy Treatment  Patient Details  Name: Terri Dickerson MRN: [id number] Date of Birth: October 11, 1960 Referring Provider: Delman Cheadle, MD  Encounter Date: 06/10/2016      PT End of Session - 06/10/16 1026    Visit Number 7   Number of Visits 12   Date for PT Re-Evaluation 07/16/16   Authorization Type private insurance   PT Start Time 1017   PT Stop Time 1100   PT Time Calculation (min) 43 min   Equipment Utilized During Treatment Gait belt   Activity Tolerance Patient tolerated treatment well   Behavior During Therapy [facility] for tasks assessed/performed      Past Medical History:  Diagnosis Date  . Hyperlipidemia   . Hypertension   . Stroke [facility])     History reviewed. No pertinent surgical history.  There were no vitals filed for this visit.      Subjective Assessment - 06/10/16 1024    Subjective Pt reports things are going well at home.  She is doing Medical sales representative, cooking, doing exercises.    Pertinent History BP 121/70 this morning   Patient Stated Goals Get back on center and get close to 100% if I can.  "Get back to work", "Get back to me".   Currently in Pain? No/denies                         [facility] Adult PT Treatment/Exercise - 06/10/16 0001      Ambulation/Gait   Ambulation/Gait Yes   Ambulation/Gait Assistance 6: Modified independent (Device/Increase time)   Ambulation/Gait Assistance Details Pt reports still not feeling comfortable if she had to ambulate to bus stop which is approx 7-10 walk.  Had pt ambulate over varying outdoor surfaces today x 12 mins in which she completed at mod I level with no overt LOB and no overt SOB/fatigue.  Recommend she have S for doing first time but then being able to do on her own.     Ambulation Distance (Feet) --  >1000'   Assistive device None   Gait Pattern Within Functional Limits   Ambulation Surface Level;Unlevel;Indoor;Outdoor;Paved;Grass   Stairs Yes   Stairs Assistance 7: Independent   Stair Management Technique No rails;Alternating pattern;Forwards   Number of Stairs 4  x 4 reps   Height of Stairs 6   Gait Comments Had pt work on stairs to address LTG.  Note that she is able to perform without rails in alternating fashion, therefore made more functional and had pt carry item in single UE and progressed to carrying slightly weighted crate with BUEs.  Pt able to perform stairs in this manner also at mod I level.  Discussed having her begin to do this at home, again at first with S and then at an independent level.       Self-Care   Self-Care Other Self-Care Comments   Other Self-Care Comments  PT continued to discuss doing more at home, walking to bus stop to make another trip until she returns to work to practice walking this distance, doing stairs without UE support to incorporate more function.       Neuro Re-ed    Neuro Re-ed Details  Attempted to work on functional movement moving from elevated surface to/from floor surface to challenge vestibular input.  Performed reaching task from elevated surface grasping bean  bags and placing to floor and then back up to table in both directions.  No reports of dizziness, just some faitgue.                  PT Education - 06/10/16 1929    Education provided Yes   Education Details D/C at next visit   Person(s) Educated Patient;Child(ren)   Methods Explanation   Comprehension Verbalized understanding          PT Short Term Goals - 06/10/16 1043      PT SHORT TERM GOAL #1   Title The patient will be indep with HEP for balance, coordination, and dynamic activities.   Baseline Target date 06/16/2016   Time 4   Period Weeks     PT SHORT TERM GOAL #2   Title The patient will improve Berg from 34/56 to > or equal to 40/56 to demo dec'ing risk for falls.   Baseline Berg  49/56 on 05/28/16   Time 4   Period Weeks   Status Achieved     PT SHORT TERM GOAL #3   Title The patient will improve gait speed from 2.05 ft/sec with RW to > or equal to 2.7 ft/sec with RW modified indep.   Baseline 4.05 ft/sec without a device (improved from 1.73 ft/sec without device at eval!)   Time 4   Period Weeks   Status Achieved     PT SHORT TERM GOAL #4   Title The patient will negotiate 14 steps with reciprocal pattern and R handrail modified indep.   Baseline Met on 05/28/16   Time 4   Period Weeks   Status Achieved     PT SHORT TERM GOAL #5   Title The patient will ambulate without device x 250 feet modified indep (slower pace, intermittent UE support) to demo ability to negotiate household surfaces without a device.   Baseline Met on 05/28/16   Time 4   Period Weeks   Status Achieved           PT Long Term Goals - 06/10/16 1043      PT LONG TERM GOAL #1   Title The patient will improve neuro QOL by 12% (baseline is 34.3%)   Baseline Target date 07/17/2016   Time 8   Period Weeks     PT LONG TERM GOAL #2   Title The patient will improve Berg from 34/56 to > or equal to 44/56 to demo dec'd risk for falls.   Baseline Met per STG *see above   Time 8   Period Weeks   Status Achieved     PT LONG TERM GOAL #3   Title The patient will negotiate 4 steps without rails with modified indep for community mobility.   Baseline met 06/10/16   Time 8   Period Weeks   Status Achieved     PT LONG TERM GOAL #4   Title The patient will improve gait speed without device from 1.73 ft/sec to > or equal to 2.4 ft/sec to demo dec'ing dependence on RW.   Baseline Met per STG *see above.   Time 8   Period Weeks   Status Achieved     PT LONG TERM GOAL #5   Title The patient will negotiate community surfaces x 1000 ft without a device modified indep.   Baseline Met on 06/07/2016   Time 8   Period Weeks   Status Achieved     PT LONG TERM GOAL #6  Title The patient will  improve Berg from 49/56 up to > or equal to 53/56 to demo improving high level balance.   Baseline Target date 07/17/2016   Time 4   Period Weeks   Status New     PT LONG TERM GOAL #7   Title The patient will return to independent community mobility and report confidence to bus stop independently.   Baseline met 06/10/16 (pt able to ambulate for 12 mins without break and at an independent level, recommend she do once with S, but otherwise okay to return to this activity).     Time 4   Period Weeks   Status Achieved               Plan - 06/10/16 1930    Clinical Impression Statement Session continues to work towards [place].  Pt able to meet goal for community ambulation and stairs without UE support.  Discussed D/C at next visit.  Pt verbalized understanding and agreement.     Rehab Potential Good   PT Treatment/Interventions ADLs/Self Care Home Management;Therapeutic activities;Therapeutic exercise;Balance training;Neuromuscular re-education;Gait training;Functional mobility training;Patient/family education;Vestibular   PT Next Visit Plan check remaining LTGs and D/C (go over HEP if needed)    Consulted and Agree with Plan of Care Patient;Family member/caregiver   Family Member Consulted daughter      Patient will benefit from skilled therapeutic intervention in order to improve the following deficits and impairments:  Abnormal gait, Decreased activity tolerance, Decreased balance, Decreased mobility, Postural dysfunction, Dizziness, Difficulty walking  Visit Diagnosis: Unsteadiness on feet  Other abnormalities of gait and mobility     Problem List Patient Active Problem List   Diagnosis Date Noted  . History of cerebellar stroke 05/28/2016  . 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 [place], [place], [address] Phone: [phone]   Fax:  [phone] 06/10/16, 7:34 PM  Name: Terri Dickerson MRN: [id number] Date of Birth: 1960/03/06

## 2016-06-15 ENCOUNTER — Ambulatory Visit: Payer: 59 | Admitting: Occupational Therapy

## 2016-06-15 ENCOUNTER — Telehealth: Payer: Self-pay

## 2016-06-15 ENCOUNTER — Institutional Professional Consult (permissible substitution): Payer: Managed Care, Other (non HMO) | Admitting: Neurology

## 2016-06-15 ENCOUNTER — Ambulatory Visit: Payer: 59 | Admitting: Rehabilitative and Restorative Service Providers"

## 2016-06-15 NOTE — Telephone Encounter (Signed)
Patient did not show to new sleep consult appt.  

## 2016-06-17 ENCOUNTER — Encounter: Payer: 59 | Admitting: Occupational Therapy

## 2016-06-17 ENCOUNTER — Ambulatory Visit: Payer: 59 | Admitting: Rehabilitative and Restorative Service Providers"

## 2016-06-21 ENCOUNTER — Encounter: Payer: 59 | Admitting: Occupational Therapy

## 2016-06-24 ENCOUNTER — Encounter: Payer: 59 | Admitting: Occupational Therapy

## 2016-06-29 ENCOUNTER — Encounter: Payer: 59 | Admitting: Occupational Therapy

## 2016-07-01 ENCOUNTER — Encounter: Payer: 59 | Admitting: Occupational Therapy

## 2016-07-06 ENCOUNTER — Encounter: Payer: 59 | Admitting: Occupational Therapy

## 2016-07-07 ENCOUNTER — Other Ambulatory Visit: Payer: Self-pay | Admitting: Family Medicine

## 2016-07-08 ENCOUNTER — Encounter: Payer: 59 | Admitting: Occupational Therapy

## 2016-07-08 NOTE — Telephone Encounter (Signed)
Do you want this refilled.  Rx 7 tabs only. Please advise

## 2016-07-09 NOTE — Telephone Encounter (Signed)
Per most recent bmp, pt was to stop the K supp and try to get high K foods in diet.  Recheck K level in an OV in 1-2 mos, sooner if cramping

## 2016-07-12 NOTE — Telephone Encounter (Signed)
Pt daughter confirmed to continue eating food high in K and return to clinic 1-2 months for ov. Advised, to return sooner if cramping Verbalized understanding

## 2016-07-13 ENCOUNTER — Encounter: Payer: 59 | Admitting: Occupational Therapy

## 2016-07-15 ENCOUNTER — Encounter: Payer: 59 | Admitting: Occupational Therapy

## 2016-07-26 ENCOUNTER — Encounter: Payer: Self-pay | Admitting: Rehabilitative and Restorative Service Providers"

## 2016-07-26 NOTE — Therapy (Signed)
[facility] [address] [place], [place], [address] Phone: [phone]   Fax:  [phone]  Patient Details  Name: Terri Dickerson MRN: [id number] Date of Birth: Dec 14, 1960 Referring Provider:  No ref. provider found  Encounter Date: last encounter 06/10/2016  PHYSICAL THERAPY DISCHARGE SUMMARY  Visits from Start of Care: 7  Current functional level related to goals / functional outcomes:     PT Short Term Goals - 06/10/16 1043      PT SHORT TERM GOAL #1   Title The patient will be indep with HEP for balance, coordination, and dynamic activities.   Baseline Target date 06/16/2016   Time 4   Period Weeks     PT SHORT TERM GOAL #2   Title The patient will improve Berg from 34/56 to > or equal to 40/56 to demo dec'ing risk for falls.   Baseline Berg 49/56 on 05/28/16   Time 4   Period Weeks   Status Achieved     PT SHORT TERM GOAL #3   Title The patient will improve gait speed from 2.05 ft/sec with RW to > or equal to 2.7 ft/sec with RW modified indep.   Baseline 4.05 ft/sec without a device (improved from 1.73 ft/sec without device at eval!)   Time 4   Period Weeks   Status Achieved     PT SHORT TERM GOAL #4   Title The patient will negotiate 14 steps with reciprocal pattern and R handrail modified indep.   Baseline Met on 05/28/16   Time 4   Period Weeks   Status Achieved     PT SHORT TERM GOAL #5   Title The patient will ambulate without device x 250 feet modified indep (slower pace, intermittent UE support) to demo ability to negotiate household surfaces without a device.   Baseline Met on 05/28/16   Time 4   Period Weeks   Status Achieved         PT Long Term Goals - 07/26/16 1419      PT LONG TERM GOAL #1   Title The patient will improve neuro QOL by 12% (baseline is 34.3%)   Baseline Not retested at discharge.   Time 8   Period Weeks   Status Deferred     PT LONG TERM GOAL #2   Title  The patient will improve Berg from 34/56 to > or equal to 44/56 to demo dec'd risk for falls.   Baseline Met per STG *see above   Time 8   Period Weeks   Status Achieved     PT LONG TERM GOAL #3   Title The patient will negotiate 4 steps without rails with modified indep for community mobility.   Baseline met 06/10/16   Time 8   Period Weeks   Status Achieved     PT LONG TERM GOAL #4   Title The patient will improve gait speed without device from 1.73 ft/sec to > or equal to 2.4 ft/sec to demo dec'ing dependence on RW.   Baseline Met per STG *see above.   Time 8   Period Weeks   Status Achieved     PT LONG TERM GOAL #5   Title The patient will negotiate community surfaces x 1000 ft without a device modified indep.   Baseline Met on 06/07/2016   Time 8   Period Weeks   Status Achieved     PT LONG TERM GOAL #6   Title The patient  will improve Berg from 49/56 up to > or equal to 53/56 to demo improving high level balance.   Baseline Not retested-- patient d/c earlier than target date of 07/17/16 due to improved functional mobility.   Time 4   Period Weeks   Status Deferred     PT LONG TERM GOAL #7   Title The patient will return to independent community mobility and report confidence to bus stop independently.   Baseline met 06/10/16 (pt able to ambulate for 12 mins without break and at an independent level, recommend she do once with S, but otherwise okay to return to this activity).     Time 4   Period Weeks   Status Achieved         Remaining deficits: Decreased longer distance community mobility.   Education / Equipment: HEP.  Plan: Patient agrees to discharge.  Patient goals were partially met. Patient is being discharged due to meeting the stated rehab goals.  ?????        Thank you for the referral of this patient. Rudell Cobb, MPT   [name] 07/26/2016, 2:17 PM  [facility] [address] [place], [place], [address] Phone: [phone]   Fax:  [phone]

## 2016-08-05 ENCOUNTER — Telehealth: Payer: Self-pay | Admitting: Family Medicine

## 2016-08-05 NOTE — Telephone Encounter (Signed)
Please advise 

## 2016-08-05 NOTE — Telephone Encounter (Signed)
Fine to write letter stating that.

## 2016-08-05 NOTE — Telephone Encounter (Signed)
Pt states she needs a letter stating she is not able to return to work due to her having a stroke.  Contact number [phone]

## 2016-08-06 ENCOUNTER — Telehealth: Payer: Self-pay | Admitting: Family Medicine

## 2016-08-06 NOTE — Telephone Encounter (Signed)
Pt called to check status of req. PT is asking if she can come in today for letter.

## 2016-08-06 NOTE — Telephone Encounter (Signed)
Patient came into the office and requested letter. A letter stating that she was unable to return to work due to her stroke was written, per Dr. Clelia CroftShaw, and given to patient. Patient was advised to have her job call us if they had any further questions.

## 2016-08-16 ENCOUNTER — Telehealth: Payer: Self-pay | Admitting: Family Medicine

## 2016-08-16 ENCOUNTER — Ambulatory Visit: Payer: Managed Care, Other (non HMO) | Admitting: Family Medicine

## 2016-08-16 NOTE — Telephone Encounter (Signed)
Pt stated that she is out of carvedilol (COREG) 12.5 MG tablet [[id number]][[id number]] and stated that her insurance runs out soon and that she needs to know what Dr. Clelia CroftShaw can do..  Please advise: [phone]: [phone]

## 2016-08-16 NOTE — Telephone Encounter (Signed)
Will you double check with patient that she would like me to complete the forms stating that she can't work at all anymore or does she want me to release her back to light duty (sitting work only) at some point?  Pt had an appt today which she no-showed.

## 2016-08-16 NOTE — Telephone Encounter (Signed)
Patient needs disability forms completed by Dr Clelia CroftShaw, I am assuming its for her stroke that you wrote a letter for but it doesn't look like she has been seen here in a few months so I wasn't sure how to complete the forms, I will place them in your box on 08/16/16 if you can complete them that is fine please return to the FMLA/Disability box at the 102 checkout desk within 5-7 business days. If you can not complete them let me know and I will call the patient and get her to come in for an OV.  Thank you!

## 2016-08-16 NOTE — Telephone Encounter (Signed)
Spoke to patients daughter, she stated that they need to be completed so that the mother can not go back to work at all, states they are trying to get her on long term disability and WIC. States she can walk ok but for the job she has she is not mentally there enough to do it so she needs to be out completely

## 2016-08-16 NOTE — Progress Notes (Deleted)
   Subjective:    Patient ID: Terri RilesRosalind E Rekowski, female    DOB: 09-18-1960, 56 y.o.   MRN: [id number]  HPI    Review of Systems     Objective:   Physical Exam        Assessment & Plan:

## 2016-08-18 ENCOUNTER — Encounter: Payer: Self-pay | Admitting: Family Medicine

## 2016-08-18 ENCOUNTER — Ambulatory Visit (INDEPENDENT_AMBULATORY_CARE_PROVIDER_SITE_OTHER): Payer: Managed Care, Other (non HMO) | Admitting: Family Medicine

## 2016-08-18 VITALS — BP 177/106 | HR 99 | Temp 99.2°F | Resp 18 | Ht 63.5 in | Wt 224.0 lb

## 2016-08-18 DIAGNOSIS — I10 Essential (primary) hypertension: Secondary | ICD-10-CM

## 2016-08-18 DIAGNOSIS — I69393 Ataxia following cerebral infarction: Secondary | ICD-10-CM

## 2016-08-18 DIAGNOSIS — Z5181 Encounter for therapeutic drug level monitoring: Secondary | ICD-10-CM | POA: Diagnosis not present

## 2016-08-18 DIAGNOSIS — Z8673 Personal history of transient ischemic attack (TIA), and cerebral infarction without residual deficits: Secondary | ICD-10-CM

## 2016-08-18 DIAGNOSIS — R7303 Prediabetes: Secondary | ICD-10-CM | POA: Diagnosis not present

## 2016-08-18 DIAGNOSIS — F4323 Adjustment disorder with mixed anxiety and depressed mood: Secondary | ICD-10-CM | POA: Diagnosis not present

## 2016-08-18 MED ORDER — CARVEDILOL 12.5 MG PO TABS: 12.5000 mg | ORAL_TABLET | Freq: Two times a day (BID) | ORAL | 5 refills | Status: DC

## 2016-08-18 NOTE — Telephone Encounter (Signed)
Call placed to patient regarding need for appointment, as she did not show on 08/16/16. No answer on patient phone at this time, left message for patient to return call./ S.Desare Duddy,CMA

## 2016-08-18 NOTE — Patient Instructions (Addendum)
Please resume monitoring your blood pressure outside of the office so that if it is to elevated you can call us and I can adjust your blood pressure medication. You will want to get ALL of your medicines from [facility] other than the AMLODIPINE which you will want to take to [facility] with the [facility] coupon you were given today. Call when you need med refills rather than have the pharmacy request it so that we don't accidentally send your refills to [facility]. You need blood work as soon as you get your insurance back.  IF you received an x-ray today, you will receive an invoice from [facility]. Please contact [facility] at [phone] with questions or concerns regarding your invoice.   IF you received labwork today, you will receive an invoice from [facility]. Please contact [facility] at [phone] with questions or concerns regarding your invoice.   Our billing staff will not be able to assist you with questions regarding bills from these companies.  You will be contacted with the lab results as soon as they are available. The fastest way to get your results is to activate your My Chart account. Instructions are located on the last page of this paperwork. If you have not heard from us regarding the results in 2 weeks, please contact this office.     Managing Your Hypertension Hypertension is commonly called high blood pressure. This is when the force of your blood pressing against the walls of your arteries is too strong. Arteries are blood vessels that carry blood from your heart throughout your body. Hypertension forces the heart to work harder to pump blood, and may cause the arteries to become narrow or stiff. Having untreated or uncontrolled hypertension can cause heart attack, stroke, kidney disease, and other problems. What are blood pressure readings? A blood pressure reading consists of a higher number over a lower number. Ideally, your blood pressure should be  below 120/80. The first ("top") number is called the systolic pressure. It is a measure of the pressure in your arteries as your heart beats. The second ("bottom") number is called the diastolic pressure. It is a measure of the pressure in your arteries as the heart relaxes. What does my blood pressure reading mean? Blood pressure is classified into four stages. Based on your blood pressure reading, your health care provider may use the following stages to determine what type of treatment you need, if any. Systolic pressure and diastolic pressure are measured in a unit called mm Hg. Normal  Systolic pressure: below 120.  Diastolic pressure: below 80. Elevated  Systolic pressure: 120-129.  Diastolic pressure: below 80. Hypertension stage 1  Systolic pressure: 130-139.  Diastolic pressure: 80-89. Hypertension stage 2  Systolic pressure: 140 or above.  Diastolic pressure: 90 or above. What health risks are associated with hypertension? Managing your hypertension is an important responsibility. Uncontrolled hypertension can lead to:  A heart attack.  A stroke.  A weakened blood vessel (aneurysm).  Heart failure.  Kidney damage.  Eye damage.  Metabolic syndrome.  Memory and concentration problems.  What changes can I make to manage my hypertension? Hypertension can be managed by making lifestyle changes and possibly by taking medicines. Your health care provider will help you make a plan to bring your blood pressure within a normal range. Eating and drinking  Eat a diet that is high in fiber and potassium, and low in salt (sodium), added sugar, and fat. An example eating plan is called the DASH (Dietary  Approaches to Stop Hypertension) diet. To eat this way: ? Eat plenty of fresh fruits and vegetables. Try to fill half of your plate at each meal with fruits and vegetables. ? Eat whole grains, such as whole wheat pasta, brown rice, or whole grain bread. Fill about one  quarter of your plate with whole grains. ? Eat low-fat diary products. ? Avoid fatty cuts of meat, processed or cured meats, and poultry with skin. Fill about one quarter of your plate with lean proteins such as fish, chicken without skin, beans, eggs, and tofu. ? Avoid premade and processed foods. These tend to be higher in sodium, added sugar, and fat.  Reduce your daily sodium intake. Most people with hypertension should eat less than 1,500 mg of sodium a day.  Limit alcohol intake to no more than 1 drink a day for nonpregnant women and 2 drinks a day for men. One drink equals 12 oz of beer, 5 oz of wine, or 1 oz of hard liquor. Lifestyle  Work with your health care provider to maintain a healthy body weight, or to lose weight. Ask what an ideal weight is for you.  Get at least 30 minutes of exercise that causes your heart to beat faster (aerobic exercise) most days of the week. Activities may include walking, swimming, or biking.  Include exercise to strengthen your muscles (resistance exercise), such as weight lifting, as part of your weekly exercise routine. Try to do these types of exercises for 30 minutes at least 3 days a week.  Do not use any products that contain nicotine or tobacco, such as cigarettes and e-cigarettes. If you need help quitting, ask your health care provider.  Control any long-term (chronic) conditions you have, such as high cholesterol or diabetes. Monitoring  Monitor your blood pressure at home as told by your health care provider. Your personal target blood pressure may vary depending on your medical conditions, your age, and other factors.  Have your blood pressure checked regularly, as often as told by your health care provider. Working with your health care provider  Review all the medicines you take with your health care provider because there may be side effects or interactions.  Talk with your health care provider about your diet, exercise habits,  and other lifestyle factors that may be contributing to hypertension.  Visit your health care provider regularly. Your health care provider can help you create and adjust your plan for managing hypertension. Will I need medicine to control my blood pressure? Your health care provider may prescribe medicine if lifestyle changes are not enough to get your blood pressure under control, and if:  Your systolic blood pressure is 130 or higher.  Your diastolic blood pressure is 80 or higher.  Take medicines only as told by your health care provider. Follow the directions carefully. Blood pressure medicines must be taken as prescribed. The medicine does not work as well when you skip doses. Skipping doses also puts you at risk for problems. Contact a health care provider if:  You think you are having a reaction to medicines you have taken.  You have repeated (recurrent) headaches.  You feel dizzy.  You have swelling in your ankles.  You have trouble with your vision. Get help right away if:  You develop a severe headache or confusion.  You have unusual weakness or numbness, or you feel faint.  You have severe pain in your chest or abdomen.  You vomit repeatedly.  You have trouble breathing.  Summary  Hypertension is when the force of blood pumping through your arteries is too strong. If this condition is not controlled, it may put you at risk for serious complications.  Your personal target blood pressure may vary depending on your medical conditions, your age, and other factors. For most people, a normal blood pressure is less than 120/80.  Hypertension is managed by lifestyle changes, medicines, or both. Lifestyle changes include weight loss, eating a healthy, low-sodium diet, exercising more, and limiting alcohol. This information is not intended to replace advice given to you by your health care provider. Make sure you discuss any questions you have with your health care  provider. Document Released: 09/29/2011 Document Revised: 12/03/2015 Document Reviewed: 12/03/2015 Elsevier Interactive Patient Education  [employer]2018 Elsevier Inc.

## 2016-08-18 NOTE — Progress Notes (Signed)
Subjective:  By signing my name below, I, Terri Dickerson, attest that this documentation has been prepared under the direction and in the presence of [name], MD Electronically Signed: [name], ED Scribe 08/18/2016 at 4:57 PM.   Patient ID: Terri Dickerson, female    DOB: [birth year], 56 y.o.   MRN: [id number]  Chief Complaint  Patient presents with  . Medication Refill    coreg  . Depression   HPI  Terri Dickerson is a 56 y.o. female who presents to Primary Care at [facility] for medication refill of Coreg and FMLA paperwork.  -------------- Prior hx: She had a CVA in [place], [place], hospitalization for 5 days. Was not having routine medical care. Presented with dizziness on 4/17. Her potassium wad 3.2 when checked 2 weeks ago, so put on potassium supplements; 20 QD for 1 week. Referred pt to neurology. She was also referred for a sleep study, she started PT and OT. She has an appt scheduled with Dr. Frances Furbish on 06/15/16. Pt had a MRI that showed a cerebellar stroke, acute infarction of the left cerebellar hemispheres and left cerebellar tonsil. She had a CTA of the head and neck with no pathologic findings and common carotid arteries and bifurcations. Vertebral arteries are nl. Echocardiogram 4/18 with bubble study, EF > 55%, nl left ventricles, dilated inferior vena cava suggested right arteriole pressures. No interatrial shunts. No suggestion of pulmonary HTN. 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  She was seing Dr. Georgina Pillion at [facility]; last visit in June. Send copy of my notes and labs to: [facility]. Dr. Charletta Cousin [address] [place] [address] Fax: [phone] Phone: [phone]  H/o fine upper body strength. No trouble using utensils. Pt was referred for sleep apnea test which she no showed. Discharged from PT on 7/9.   Pt states that PT went great. She has noticed the she intermittently double steps with walking. Pt is driving back  roads but has not driven on the highway yet. States she is extremely cautious with driving more than 35 mph.   She denies loss of memory, difficulty focusing but is unsure if she focuses too much/little. States she is a movie buff and was watching a movie with a friend when she was recalling bits and pieces of the movie. She states her mind sometimes gets ahead of her.   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 She states that she is able to do a task for 1 hour but has to sit for ~15 minutes to catch her breath. States a simple task that usually takes her 30 minutes-1 hour now takes her 2.   HTN Pt used to check her BP outside of the office after sitting for 10-15 minutes. She reports readings of 128/80-90 before medications and 112/70 in the evenings after medications. She stopped checking her BP outside of the office a few weeks ago.   Depression Pt states that she has been feeling depressed due to her Dickerson pilling in lately. She states she has been teary eyed more lately and just wants "everything to stop and she wants to get back to her normal". Denies SI/HI.  Past Medical History:  Diagnosis Date  . Hyperlipidemia   . Hypertension   . Stroke [facility])    Current Outpatient Prescriptions on File Prior to Visit  Medication Sig Dispense Refill  .  amLODipine (NORVASC) 10 MG tablet Take 1 tablet (10 mg total) by mouth daily. 30 tablet 2  . aspirin 81 MG chewable tablet Chew by mouth daily.    Marland Kitchen. atorvastatin (LIPITOR) 80 MG tablet Take 80 mg by mouth daily.    . hydrochlorothiazide (HYDRODIURIL) 25 MG tablet TAKE 1 TABLET(25 MG) BY MOUTH DAILY 90 tablet 0  . lisinopril (PRINIVIL,ZESTRIL) 10 MG tablet Take 2 tablets (20 mg total) by mouth daily.    . ondansetron (ZOFRAN-ODT) 4 MG disintegrating tablet Take 4 mg by mouth every 8 (eight) hours as needed for nausea or vomiting.    . potassium chloride SA (K-DUR,KLOR-CON) 20 MEQ  tablet Take 1 tablet (20 mEq total) by mouth daily. 7 tablet 3  . carvedilol (COREG) 12.5 MG tablet Take 12.5 mg by mouth 2 (two) times daily with a meal.     No current facility-administered medications on file prior to visit.    No Known Allergies No past surgical history on file. Family History  Problem Relation Age of Onset  . Hypertension Mother   . Hyperlipidemia Mother    Social History   Social History  . Marital status: Married    Spouse name: N/A  . Number of children: N/A  . Years of education: N/A   Social History Main Topics  . Smoking status: Never Smoker  . Smokeless tobacco: Never Used  . Alcohol use 0.0 oz/week  . Drug use: No  . Sexual activity: Not Asked   Other Topics Concern  . None   Social History Narrative  . None   Dep[facility] screen [facility] 2[facility] 08/18/2016 05/13/2016  Decreased Interest 2 0  Down, Depressed, Hopeless 2 0  PHQ - 2 Score 4 0  Altered sleeping 2 -  Tired, decreased energy 2 -  Change in appetite 2 -  Feeling bad or failure about yourself  2 -  Trouble concentrating 2 -  Moving slowly or fidgety/restless 2 -  Suicidal thoughts 0 -  PHQ-9 Score 16 -  Difficult doing work/chores Very difficult -    Review of Systems  Psychiatric/Behavioral: Positive for dysphoric mood. Negative for suicidal ideas.      Objective:   Physical Exam  Constitutional: She is oriented to person, place, and time. She appears well-developed and well-nourished. No distress.  HENT:  Head: Normocephalic and atraumatic.  Eyes: Conjunctivae and EOM are normal.  Neck: Neck supple. No tracheal deviation present.  Cardiovascular: Normal rate, regular rhythm and normal heart sounds.   Pulmonary/Chest: Effort normal and breath sounds normal. No respiratory distress.  Musculoskeletal: Normal range of motion.  Neurological: She is alert and oriented to person, place, and time.  Skin: Skin is warm and dry.  Psychiatric: She has a normal mood and affect. Her  behavior is normal.  Nursing note and vitals reviewed.  BP (!) 160/81   Pulse 99   Temp 99.2 F (37.3 C) (Oral)   Resp 18   Ht 5' 3.5" (1.613 m)   Wt 224 lb (101.6 kg)   LMP  (LMP Unknown)   SpO2 98%   BMI 39.06 kg/m     Assessment & Plan:   1. 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 History of cerebellar stroke   3. Essential hypertension   4. Medication monitoring encounter   5. Prediabetes   6. Adjustment disorder with mixed anxiety and depressed mood     Meds ordered this encounter  Medications  . carvedilol (COREG) 12.5 MG  tablet    Sig: Take 1 tablet (12.5 mg total) by mouth 2 (two) times daily with a meal.    Dispense:  60 tablet    Refill:  5    I personally performed the services described in this documentation, which was scribed in my presence. The recorded information has been reviewed and considered, and addended by me as needed.   Norberto SorensonEva Jibreel Fedewa, M.D.  [facility] at [facility]  [place] [address] [place], [place] [id number] [phone] phone [phone] fax  08/19/16 11:56 PM

## 2016-08-19 ENCOUNTER — Institutional Professional Consult (permissible substitution): Payer: Managed Care, Other (non HMO) | Admitting: Neurology

## 2016-08-19 NOTE — Telephone Encounter (Signed)
papework scanned and faxed to employer on 08/19/16

## 2016-08-21 IMAGING — DX DG CHEST 2V
2 series · 2 of 2 positions shown · non-contrast
Comparison: None.

CLINICAL DATA: Left upper extremity pain after lifting a patient at
work this evening. Hypertensive.

EXAM:
CHEST  2 VIEW

[chest pa]
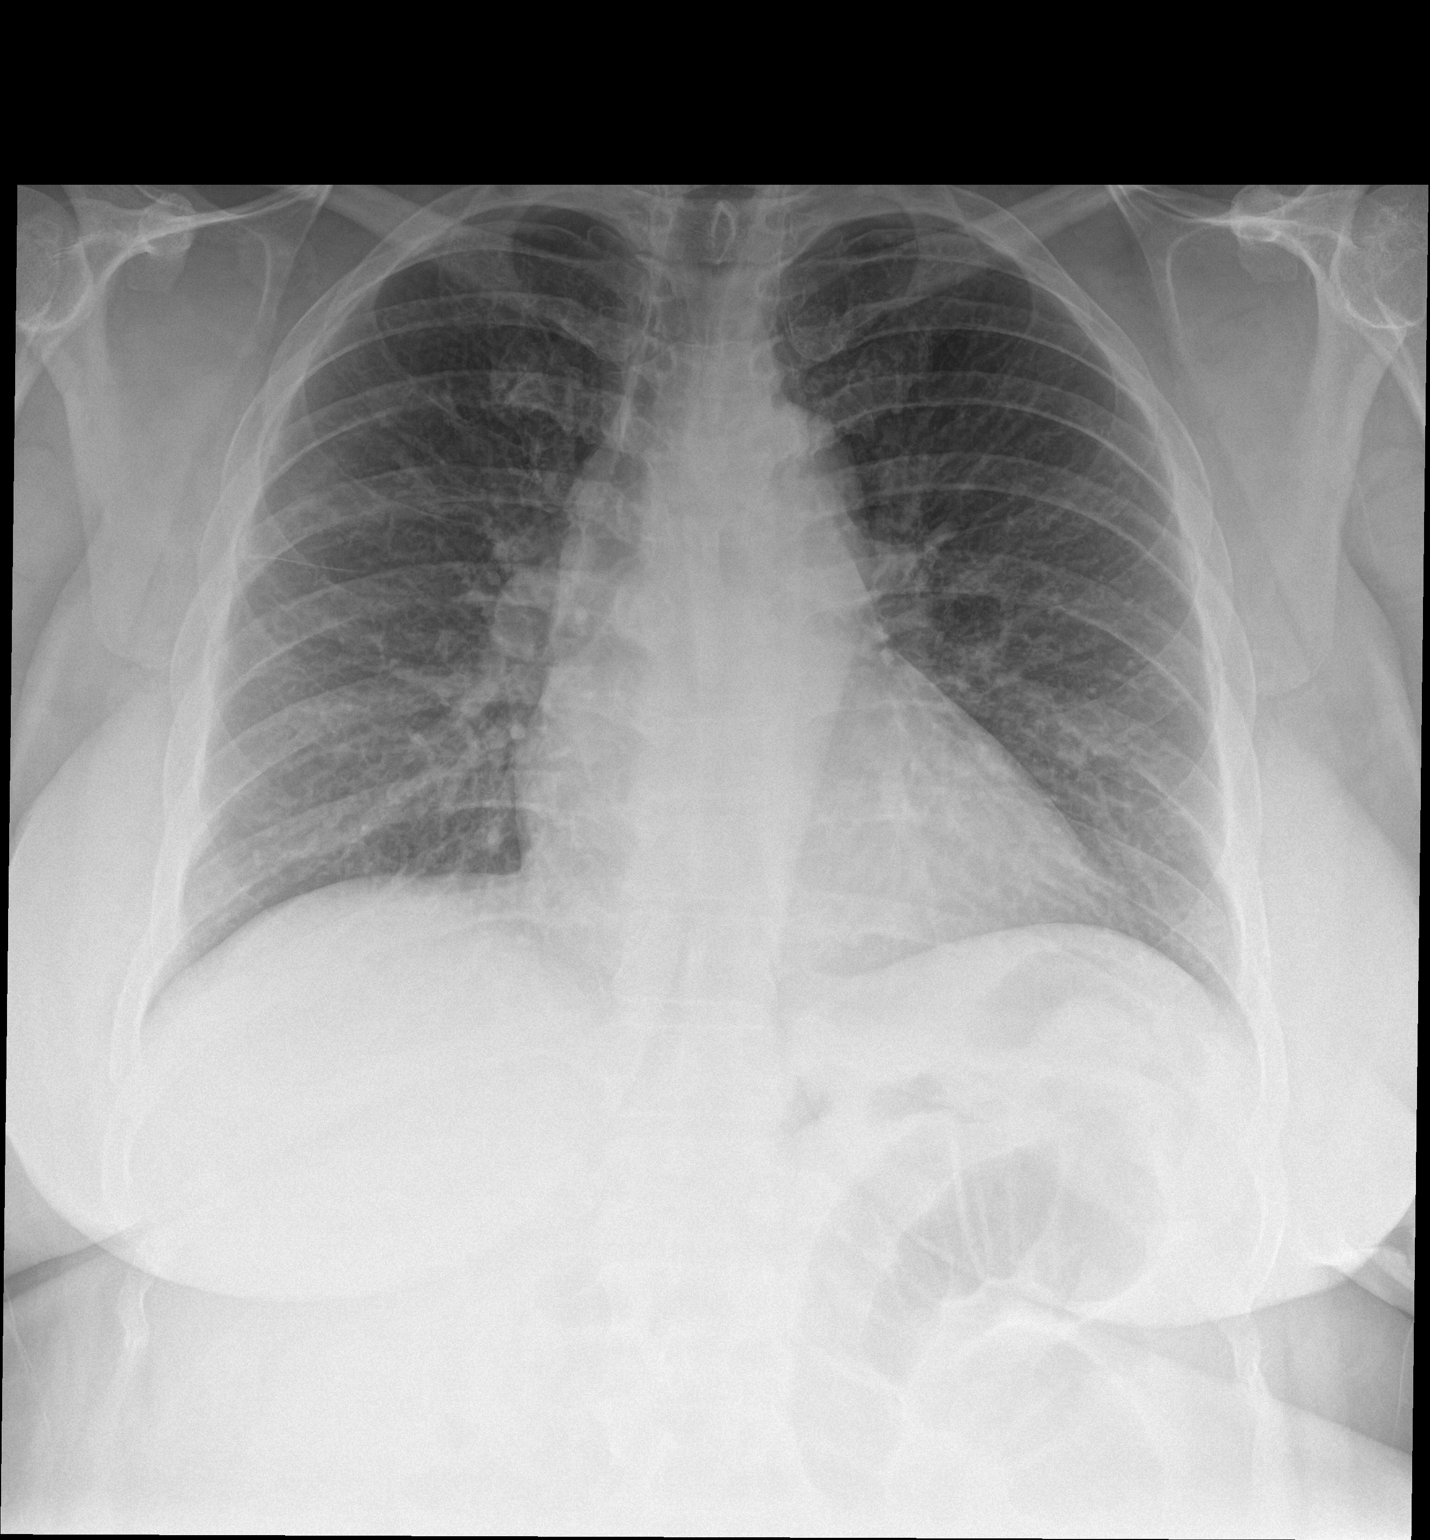

[chest lat]
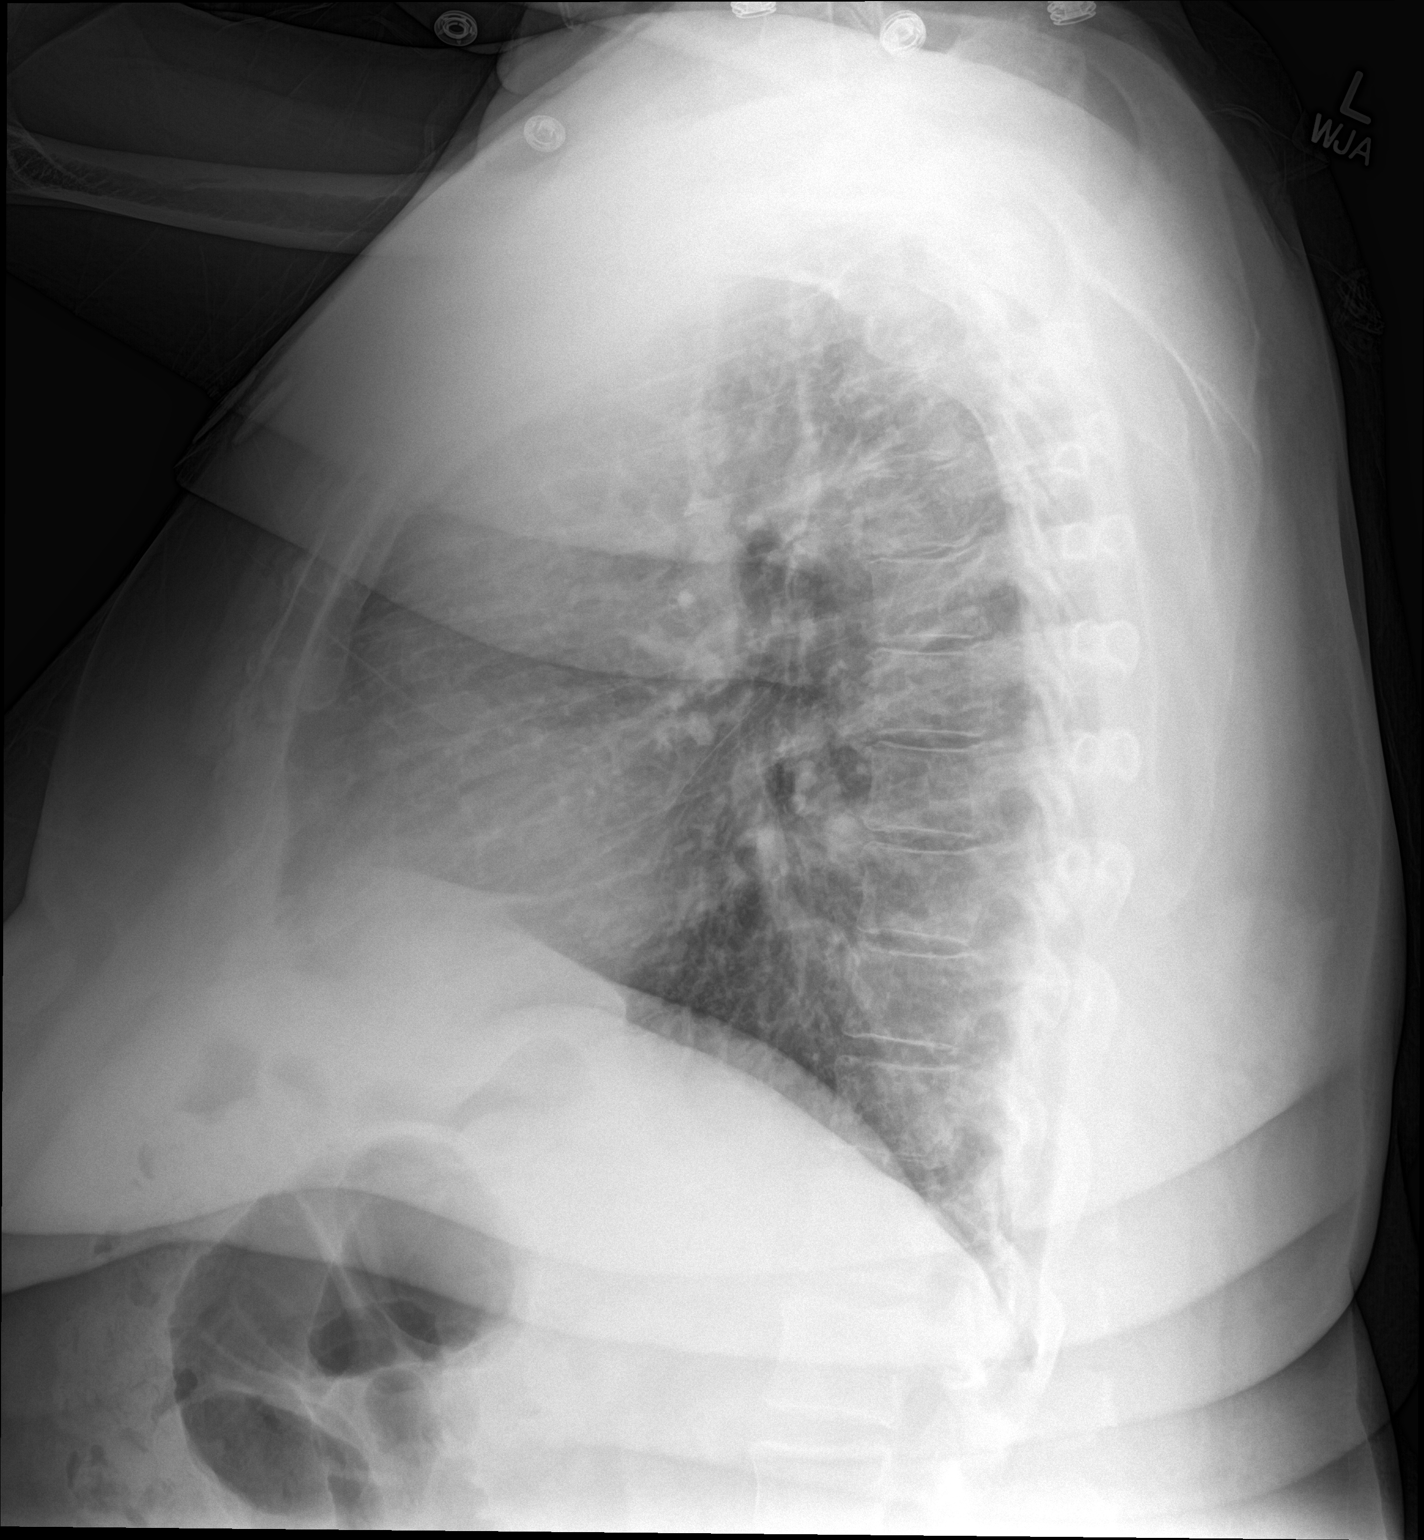

[2 of 2 positions shown; findings below may reference images not displayed]

FINDINGS: The heart size and mediastinal contours are within normal limits.
Both lungs are clear. The visualized skeletal structures are
unremarkable.
IMPRESSION: No active cardiopulmonary disease.

## 2016-08-25 ENCOUNTER — Other Ambulatory Visit: Payer: Self-pay

## 2016-08-25 ENCOUNTER — Telehealth: Payer: Self-pay | Admitting: Family Medicine

## 2016-08-25 NOTE — Telephone Encounter (Signed)
DR Clelia CroftSHAW PT CALING FOR A REFILL ON LISINOPRIL PLEASE SEND TO [facility] [address]

## 2016-08-26 ENCOUNTER — Institutional Professional Consult (permissible substitution): Payer: Managed Care, Other (non HMO) | Admitting: Neurology

## 2016-08-26 ENCOUNTER — Telehealth: Payer: Self-pay | Admitting: Family Medicine

## 2016-08-26 MED ORDER — LISINOPRIL 20 MG PO TABS: 20.0000 mg | ORAL_TABLET | Freq: Every day | ORAL | 1 refills | Status: DC

## 2016-08-26 NOTE — Telephone Encounter (Signed)
Has already been filled today.  °

## 2016-08-26 NOTE — Telephone Encounter (Signed)
[facility] called to let us know that the pt was scheduled for an appt on 08/26/16 but called and canceled because she does not currently have insurance. The pt said she will call back to reschedule. Zenon MayoSheena can be reached at [phone]. Thanks.

## 2016-08-26 NOTE — Telephone Encounter (Signed)
noted 

## 2016-09-02 ENCOUNTER — Encounter: Payer: Self-pay | Admitting: Family Medicine

## 2016-09-02 ENCOUNTER — Ambulatory Visit (INDEPENDENT_AMBULATORY_CARE_PROVIDER_SITE_OTHER): Payer: Self-pay | Admitting: Family Medicine

## 2016-09-02 VITALS — BP 155/82 | HR 83 | Temp 98.2°F | Resp 18 | Ht 63.98 in | Wt 228.2 lb

## 2016-09-02 DIAGNOSIS — Z8673 Personal history of transient ischemic attack (TIA), and cerebral infarction without residual deficits: Secondary | ICD-10-CM

## 2016-09-02 DIAGNOSIS — I10 Essential (primary) hypertension: Secondary | ICD-10-CM

## 2016-09-02 DIAGNOSIS — I69393 Ataxia following cerebral infarction: Secondary | ICD-10-CM

## 2016-09-02 DIAGNOSIS — R7303 Prediabetes: Secondary | ICD-10-CM

## 2016-09-02 MED ORDER — ATORVASTATIN CALCIUM 80 MG PO TABS: 80.0000 mg | ORAL_TABLET | Freq: Every day | ORAL | 3 refills | Status: DC

## 2016-09-02 MED ORDER — AMLODIPINE BESYLATE 10 MG PO TABS: 10.0000 mg | ORAL_TABLET | Freq: Every day | ORAL | 3 refills | Status: DC

## 2016-09-02 MED ORDER — CARVEDILOL 12.5 MG PO TABS: 12.5000 mg | ORAL_TABLET | Freq: Two times a day (BID) | ORAL | 3 refills | Status: DC

## 2016-09-02 MED ORDER — LISINOPRIL-HYDROCHLOROTHIAZIDE 20-25 MG PO TABS
1.0000 | ORAL_TABLET | Freq: Every day | ORAL | 3 refills | Status: DC
Start: 2016-09-02 — End: 2016-09-25

## 2016-09-02 MED ORDER — LISINOPRIL-HYDROCHLOROTHIAZIDE 20-25 MG PO TABS: 1.0000 | ORAL_TABLET | Freq: Every day | ORAL | 0 refills | Status: DC

## 2016-09-02 MED ORDER — LISINOPRIL-HYDROCHLOROTHIAZIDE 20-25 MG PO TABS: 1.0000 | ORAL_TABLET | Freq: Every day | ORAL | 3 refills | Status: DC

## 2016-09-02 NOTE — Patient Instructions (Addendum)
We recommend that you schedule a mammogram for breast cancer screening. Typically, you do not need a referral to do this. Please contact a local imaging center to schedule your mammogram.   Hospital - [phone]  *ask for the Radiology Department [facility]) - [phone] or [phone]  [facility] - [phone] [facility] - [phone] [facility] - [phone]  *ask for the Radiology Department [facility] - [phone]  *ask for the Radiology Department [facility] - [phone]  *ask for the Mammography Department [facility] - [phone]    IF you received an x-ray today, you will receive an invoice from [facility]. Please contact [facility] at [phone] with questions or concerns regarding your invoice.   IF you received labwork today, you will receive an invoice from [facility]. Please contact [facility] at [phone] with questions or concerns regarding your invoice.   Our billing staff will not be able to assist you with questions regarding bills from these companies.  You will be contacted with the lab results as soon as they are available. The fastest way to get your results is to activate your My Chart account. Instructions are located on the last page of this paperwork. If you have not heard from us regarding the results in 2 weeks, please contact this office.      Managing Your Hypertension Hypertension is commonly called high blood pressure. This is when the force of your blood pressing against the walls of your arteries is too strong. Arteries are blood vessels that carry blood from your heart throughout your body. Hypertension forces the heart to work harder to pump blood, and may cause the arteries to become narrow or stiff. Having untreated or uncontrolled hypertension can cause heart attack, stroke, kidney disease, and  other problems. What are blood pressure readings? A blood pressure reading consists of a higher number over a lower number. Ideally, your blood pressure should be below 120/80. The first ("top") number is called the systolic pressure. It is a measure of the pressure in your arteries as your heart beats. The second ("bottom") number is called the diastolic pressure. It is a measure of the pressure in your arteries as the heart relaxes. What does my blood pressure reading mean? Blood pressure is classified into four stages. Based on your blood pressure reading, your health care provider may use the following stages to determine what type of treatment you need, if any. Systolic pressure and diastolic pressure are measured in a unit called mm Hg. Normal  Systolic pressure: below 120.  Diastolic pressure: below 80. Elevated  Systolic pressure: 120-129.  Diastolic pressure: below 80. Hypertension stage 1  Systolic pressure: 130-139.  Diastolic pressure: 80-89. Hypertension stage 2  Systolic pressure: 140 or above.  Diastolic pressure: 90 or above. What health risks are associated with hypertension? Managing your hypertension is an important responsibility. Uncontrolled hypertension can lead to:  A heart attack.  A stroke.  A weakened blood vessel (aneurysm).  Heart failure.  Kidney damage.  Eye damage.  Metabolic syndrome.  Memory and concentration problems.  What changes can I make to manage my hypertension? Hypertension can be managed by making lifestyle changes and possibly by taking medicines. Your health care provider will help you make a plan to bring your blood pressure within a normal range. Eating and drinking  Eat a diet that is high in fiber and potassium, and low in   salt (sodium), added sugar, and fat. An example eating plan is called the DASH (Dietary Approaches to Stop Hypertension) diet. To eat this way: ? Eat plenty of fresh fruits and vegetables. Try to  fill half of your plate at each meal with fruits and vegetables. ? Eat whole grains, such as whole wheat pasta, brown rice, or whole grain bread. Fill about one quarter of your plate with whole grains. ? Eat low-fat diary products. ? Avoid fatty cuts of meat, processed or cured meats, and poultry with skin. Fill about one quarter of your plate with lean proteins such as fish, chicken without skin, beans, eggs, and tofu. ? Avoid premade and processed foods. These tend to be higher in sodium, added sugar, and fat.  Reduce your daily sodium intake. Most people with hypertension should eat less than 1,500 mg of sodium a day.  Limit alcohol intake to no more than 1 drink a day for nonpregnant women and 2 drinks a day for men. One drink equals 12 oz of beer, 5 oz of wine, or 1 oz of hard liquor. Lifestyle  Work with your health care provider to maintain a healthy body weight, or to lose weight. Ask what an ideal weight is for you.  Get at least 30 minutes of exercise that causes your heart to beat faster (aerobic exercise) most days of the week. Activities may include walking, swimming, or biking.  Include exercise to strengthen your muscles (resistance exercise), such as weight lifting, as part of your weekly exercise routine. Try to do these types of exercises for 30 minutes at least 3 days a week.  Do not use any products that contain nicotine or tobacco, such as cigarettes and e-cigarettes. If you need help quitting, ask your health care provider.  Control any long-term (chronic) conditions you have, such as high cholesterol or diabetes. Monitoring  Monitor your blood pressure at home as told by your health care provider. Your personal target blood pressure may vary depending on your medical conditions, your age, and other factors.  Have your blood pressure checked regularly, as often as told by your health care provider. Working with your health care provider  Review all the medicines you  take with your health care provider because there may be side effects or interactions.  Talk with your health care provider about your diet, exercise habits, and other lifestyle factors that may be contributing to hypertension.  Visit your health care provider regularly. Your health care provider can help you create and adjust your plan for managing hypertension. Will I need medicine to control my blood pressure? Your health care provider may prescribe medicine if lifestyle changes are not enough to get your blood pressure under control, and if:  Your systolic blood pressure is 130 or higher.  Your diastolic blood pressure is 80 or higher.  Take medicines only as told by your health care provider. Follow the directions carefully. Blood pressure medicines must be taken as prescribed. The medicine does not work as well when you skip doses. Skipping doses also puts you at risk for problems. Contact a health care provider if:  You think you are having a reaction to medicines you have taken.  You have repeated (recurrent) headaches.  You feel dizzy.  You have swelling in your ankles.  You have trouble with your vision. Get help right away if:  You develop a severe headache or confusion.  You have unusual weakness or numbness, or you feel faint.  You have severe   pain in your chest or abdomen.  You vomit repeatedly.  You have trouble breathing. Summary  Hypertension is when the force of blood pumping through your arteries is too strong. If this condition is not controlled, it may put you at risk for serious complications.  Your personal target blood pressure may vary depending on your medical conditions, your age, and other factors. For most people, a normal blood pressure is less than 120/80.  Hypertension is managed by lifestyle changes, medicines, or both. Lifestyle changes include weight loss, eating a healthy, low-sodium diet, exercising more, and limiting alcohol. This  information is not intended to replace advice given to you by your health care provider. Make sure you discuss any questions you have with your health care provider. Document Released: 09/29/2011 Document Revised: 12/03/2015 Document Reviewed: 12/03/2015 Elsevier Interactive Patient Education  2018 Elsevier Inc.  

## 2016-09-02 NOTE — Progress Notes (Signed)
Subjective:    Patient ID: Terri Dickerson, female    DOB: 08-Jan-1961, 56 y.o.   MRN: [id number] Chief Complaint  Patient presents with  . Hypertension  . Medication Refill    Hydrochorothiazde, lisinopril, aspirin 81mg  chewable, norvasc, lipitor    HPI Has been out of lisinopril for about a week and out of the hctz for longer - she thought she was no longer on this. Sh e did not know the lisnopril had been refilled. 2 weeks prior  Past Medical History:  Diagnosis Date  . Hyperlipidemia   . Hypertension   . Stroke [facility])    History reviewed. No pertinent surgical history. Current Outpatient Prescriptions on File Prior to Visit  Medication Sig Dispense Refill  . aspirin 81 MG chewable tablet Chew by mouth daily.     No current facility-administered medications on file prior to visit.    No Known Allergies Family History  Problem Relation Age of Onset  . Hypertension Mother   . Hyperlipidemia Mother    Social History   Social History  . Marital status: Married    Spouse name: N/A  . Number of children: N/A  . Years of education: N/A   Social History Main Topics  . Smoking status: Never Smoker  . Smokeless tobacco: Never Used  . Alcohol use 0.0 oz/week  . Drug use: No  . Sexual activity: Not Asked   Other Topics Concern  . None   Social History Narrative  . None   Depression screen [facility] 2/9 09/02/2016 08/18/2016 05/13/2016  Decreased Interest 1 2 0  Down, Depressed, Hopeless 2 2 0  PHQ - 2 Score 3 4 0  Altered sleeping 1 2 -  Tired, decreased energy 1 2 -  Change in appetite 1 2 -  Feeling bad or failure about yourself  1 2 -  Trouble concentrating 0 2 -  Moving slowly or fidgety/restless 1 2 -  Suicidal thoughts 0 0 -  PHQ-9 Score 8 16 -  Difficult doing work/chores Somewhat difficult Very difficult -     Review of Systems See hpi    Objective:   Physical Exam  Constitutional: She is oriented to person, place, and time. She appears  well-developed and well-nourished. No distress.  HENT:  Head: Normocephalic and atraumatic.  Right Ear: External ear normal.  Left Ear: External ear normal.  Eyes: Conjunctivae are normal. No scleral icterus.  Neck: Normal range of motion. Neck supple. No thyromegaly present.  Cardiovascular: Normal rate, regular rhythm, normal heart sounds and intact distal pulses.   Pulmonary/Chest: Effort normal and breath sounds normal. No respiratory distress.  Musculoskeletal: She exhibits no edema.  Lymphadenopathy:    She has no cervical adenopathy.  Neurological: She is alert and oriented to person, place, and time.  Skin: Skin is warm and dry. She is not diaphoretic. No erythema.  Psychiatric: She has a normal mood and affect. Her behavior is normal.     BP (!) 155/82 (BP Location: Right Arm, Patient Position: Sitting, Cuff Size: Large)   Pulse 83   Temp 98.2 F (36.8 C) (Oral)   Resp 18   Ht 5' 3.98" (1.625 m)   Wt 228 lb 3.2 oz (103.5 kg)   LMP  (LMP Unknown)   SpO2 97%   BMI 39.20 kg/m   Assessment & Plan:    1. Essential hypertension   2. History of cerebellar stroke   3. 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 Prediabetes      Meds ordered this encounter  Medications  . DISCONTD: lisinopril-hydrochlorothiazide (PRINZIDE,ZESTORETIC) 20-25 MG tablet    Sig: Take 1 tablet by mouth daily.    Dispense:  30 tablet    Refill:  0  . DISCONTD: lisinopril-hydrochlorothiazide (PRINZIDE,ZESTORETIC) 20-25 MG tablet    Sig: Take 1 tablet by mouth daily.    Dispense:  90 tablet    Refill:  3  . DISCONTD: carvedilol (COREG) 12.5 MG tablet    Sig: Take 1 tablet (12.5 mg total) by mouth 2 (two) times daily with a meal.    Dispense:  180 tablet    Refill:  3  . DISCONTD: atorvastatin (LIPITOR) 80 MG tablet    Sig: Take 1 tablet (80 mg total) by mouth daily.    Dispense:  90 tablet    Refill:  3  . DISCONTD: amLODipine (NORVASC) 10 MG tablet    Sig: Take 1 tablet  (10 mg total) by mouth daily.    Dispense:  90 tablet    Refill:  3  . DISCONTD: lisinopril-hydrochlorothiazide (PRINZIDE,ZESTORETIC) 20-25 MG tablet    Sig: Take 1 tablet by mouth daily.    Dispense:  30 tablet    Refill:  0  . DISCONTD: lisinopril-hydrochlorothiazide (PRINZIDE,ZESTORETIC) 20-25 MG tablet    Sig: Take 1 tablet by mouth daily.    Dispense:  30 tablet    Refill:  0  . amLODipine (NORVASC) 10 MG tablet    Sig: Take 1 tablet (10 mg total) by mouth daily.    Dispense:  90 tablet    Refill:  3  . atorvastatin (LIPITOR) 80 MG tablet    Sig: Take 1 tablet (80 mg total) by mouth daily.    Dispense:  90 tablet    Refill:  3  . carvedilol (COREG) 12.5 MG tablet    Sig: Take 1 tablet (12.5 mg total) by mouth 2 (two) times daily with a meal.    Dispense:  180 tablet    Refill:  3  . lisinopril-hydrochlorothiazide (PRINZIDE,ZESTORETIC) 20-25 MG tablet    Sig: Take 1 tablet by mouth daily.    Dispense:  90 tablet    Refill:  3    Norberto SorensonEva Shaw, M.D.  [facility] at [facility]  [place] [address] [place], [place] [address] [phone] phone [phone] fax  09/05/16 12:19 PM

## 2016-09-03 ENCOUNTER — Telehealth: Payer: Self-pay | Admitting: Family Medicine

## 2016-09-03 NOTE — Telephone Encounter (Signed)
Pt is calling stating that the lisinopril was called in twice and is wanting to cancel one of the prescriptions.  Please advise

## 2016-09-05 DIAGNOSIS — R7303 Prediabetes: Secondary | ICD-10-CM | POA: Insufficient documentation

## 2016-09-05 DIAGNOSIS — I10 Essential (primary) hypertension: Secondary | ICD-10-CM | POA: Insufficient documentation

## 2016-09-07 NOTE — Telephone Encounter (Signed)
Done

## 2016-09-13 ENCOUNTER — Telehealth: Payer: Self-pay | Admitting: Family Medicine

## 2016-09-13 DIAGNOSIS — Z0271 Encounter for disability determination: Secondary | ICD-10-CM

## 2016-09-17 ENCOUNTER — Telehealth: Payer: Self-pay

## 2016-09-17 MED ORDER — ASPIRIN 81 MG PO CHEW: 81.0000 mg | CHEWABLE_TABLET | Freq: Every day | ORAL | 0 refills | Status: DC

## 2016-09-17 MED ORDER — ATORVASTATIN CALCIUM 80 MG PO TABS: 80.0000 mg | ORAL_TABLET | Freq: Every day | ORAL | 0 refills | Status: DC

## 2016-09-17 MED ORDER — AMLODIPINE BESYLATE 10 MG PO TABS: 10.0000 mg | ORAL_TABLET | Freq: Every day | ORAL | 0 refills | Status: DC

## 2016-09-17 NOTE — Telephone Encounter (Signed)
Spoke with patient and she stated that she has not received any medications yet. Filled on the 16th. Sent a 30 day supply just to hold her until she receives it from the [facility] pharmacy.

## 2016-09-17 NOTE — Telephone Encounter (Signed)
Patient is completely out of medication. I seen that you sent 6 month supply on the 16th and she still has not received anything yet. I sent over 30 days to the [facility] on [address] just so she has something until it comes in from the pharmacy. I don't know exactly how to go about getting the remaining from the other pharmacy. I will call them and find out what is going on.

## 2016-09-17 NOTE — Telephone Encounter (Signed)
PATIENT STATES SHE CALLED ON Monday TO LET DR. SHAW KNOW SHE NEEDS REFILLS ON ALL OF HER MEDICATIONS. SHE IS CALLING BECAUSE NO ONE HAS CALLED HER BACK. SHE IS OUT OF HER MEDICINES AND NEEDS THEM FILLED TODAY BEFORE THE HOLIDAY WEEKEND. PLEASE CALL HER BACK AS SOON AS POSSIBLE PLEASE. BEST PHONE [phone] (CELL) PHARMACY CHOICE IS [facility] [place]. MBC

## 2016-09-24 NOTE — Telephone Encounter (Signed)
Thanks. Any update?

## 2016-09-25 MED ORDER — AMLODIPINE BESYLATE 10 MG PO TABS: 10.0000 mg | ORAL_TABLET | Freq: Every day | ORAL | 2 refills | Status: AC

## 2016-09-25 MED ORDER — CARVEDILOL 12.5 MG PO TABS: 12.5000 mg | ORAL_TABLET | Freq: Two times a day (BID) | ORAL | 2 refills | Status: AC

## 2016-09-25 MED ORDER — LISINOPRIL-HYDROCHLOROTHIAZIDE 20-25 MG PO TABS: 1.0000 | ORAL_TABLET | Freq: Every day | ORAL | 2 refills | Status: DC

## 2016-09-25 MED ORDER — ATORVASTATIN CALCIUM 80 MG PO TABS: 80.0000 mg | ORAL_TABLET | Freq: Every day | ORAL | 2 refills | Status: DC

## 2016-09-25 NOTE — Telephone Encounter (Signed)
Spoke with patient and she stated that she has not gotten anything yet in the mail from the pharmacy. I tried calling the [facility] but was unable to get through due to it be on the weekend. Will attempt to follow up on Monday. Patient states that she is almost out of her Coreg.

## 2016-09-25 NOTE — Telephone Encounter (Signed)
Thanks. Sent 3 mos of all her meds to [facility] just to give her a window to contact [facility] and figure out what is going on. If she needs some her meds sent to [facility] instead that is fine.

## 2016-09-27 NOTE — Telephone Encounter (Signed)
Attempted to call patient but no answer. Will contact another day.

## 2016-09-28 ENCOUNTER — Other Ambulatory Visit: Payer: Self-pay | Admitting: Family Medicine

## 2016-09-30 ENCOUNTER — Ambulatory Visit: Payer: Managed Care, Other (non HMO) | Admitting: Family Medicine

## 2016-10-18 ENCOUNTER — Other Ambulatory Visit: Payer: Self-pay | Admitting: Family Medicine

## 2016-10-18 NOTE — Telephone Encounter (Signed)
REFILL ON ASA  AND LIPITOR TO THE [facility] ON [address]

## 2016-10-19 ENCOUNTER — Other Ambulatory Visit: Payer: Self-pay | Admitting: *Deleted

## 2016-10-19 DIAGNOSIS — Z8673 Personal history of transient ischemic attack (TIA), and cerebral infarction without residual deficits: Secondary | ICD-10-CM

## 2016-10-19 MED ORDER — ASPIRIN 81 MG PO CHEW: 81.0000 mg | CHEWABLE_TABLET | Freq: Every day | ORAL | 0 refills | Status: DC

## 2016-10-19 NOTE — Telephone Encounter (Signed)
Left detailed message per Hippa her prescriptions were sent to another [facility] she can call the [facility] she wants it to go to they can transfer it.

## 2016-11-14 ENCOUNTER — Other Ambulatory Visit: Payer: Self-pay | Admitting: Family Medicine

## 2016-11-19 ENCOUNTER — Other Ambulatory Visit: Payer: Self-pay | Admitting: Obstetrics and Gynecology

## 2016-11-19 DIAGNOSIS — Z1231 Encounter for screening mammogram for malignant neoplasm of breast: Secondary | ICD-10-CM

## 2016-12-16 ENCOUNTER — Ambulatory Visit (HOSPITAL_COMMUNITY): Payer: 59

## 2017-02-14 DIAGNOSIS — Z0271 Encounter for disability determination: Secondary | ICD-10-CM

## 2020-08-10 ENCOUNTER — Ambulatory Visit (HOSPITAL_COMMUNITY)
Admission: EM | Admit: 2020-08-10 | Discharge: 2020-08-10 | Disposition: A | Discharge status: Home / Self Care | Payer: Medicare (Managed Care) | Attending: Emergency Medicine | Admitting: Emergency Medicine

## 2020-08-10 ENCOUNTER — Other Ambulatory Visit: Payer: Self-pay

## 2020-08-10 ENCOUNTER — Encounter (HOSPITAL_COMMUNITY): Payer: Self-pay

## 2020-08-10 DIAGNOSIS — M2669 Other specified disorders of temporomandibular joint: Secondary | ICD-10-CM

## 2020-08-10 MED ORDER — IBUPROFEN 800 MG PO TABS: 800.0000 mg | ORAL_TABLET | Freq: Three times a day (TID) | ORAL | 0 refills | Status: DC

## 2020-08-10 NOTE — Discharge Instructions (Addendum)
Take the ibuprofen three times a day for the next 3-5 days.    You can apply heat, ice, or alternate between heat and ice for comfort.    Return or go to the Emergency Department if symptoms worsen or do not improve in the next few days.

## 2020-08-10 NOTE — ED Triage Notes (Signed)
Pt present a headache, symptoms started today and the headache is behind her ear on the left side.

## 2020-08-10 NOTE — ED Provider Notes (Signed)
[facility]    CSN: [id number] Arrival date & time: 08/10/20  1629      History   Chief Complaint Chief Complaint  Patient presents with   Headache    HPI Terri Dickerson is a 60 y.o. female.   Patient here for evaluation of left sided head/jaw pain that started today.  Reports pain is intermittent and sharp.  Has not taken any OTC Medications or treatments.  Denies any trauma, injury, or other precipitating event.  Denies any specific alleviating or aggravating factors.  Denies any fevers, chest pain, shortness of breath, N/V/D, numbness, tingling, weakness, abdominal pain, or headaches.     The history is provided by the patient.  Headache  Past Medical History:  Diagnosis Date   Hyperlipidemia    Hypertension    Stroke 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 No pertinent surgical history.  OB History   No obstetric history on file.      Home Medications    Prior to Admission medications   Medication Sig Start Date End Date Taking? Authorizing Provider  ibuprofen (ADVIL) 800 MG tablet Take 1 tablet (800 mg total) by mouth 3 (three) times daily. 08/10/20  Yes Ivette Loyal, NP  amLODipine (NORVASC) 10 MG tablet Take 1 tablet (10 mg total) by mouth daily. 09/25/16   Sherren Mocha, MD  aspirin 81 MG chewable tablet CHEW ONE TABLET BY MOUTH DAILY 11/16/16   Sherren Mocha, MD  atorvastatin (LIPITOR) 80 MG tablet Take 1 tablet (80 mg total) by mouth daily. 09/25/16   Sherren Mocha, MD  carvedilol (COREG) 12.5 MG tablet Take 1 tablet (12.5 mg total) by mouth 2 (two) times daily with a meal. 09/25/16   Sherren Mocha, MD  lisinopril-hydrochlorothiazide  (PRINZIDE,ZESTORETIC) 20-25 MG tablet Take 1 tablet by mouth daily. 09/25/16   Sherren Mocha, MD    Family History Family History  Problem Relation Age of Onset   Hypertension Mother    Hyperlipidemia Mother     Social History Social History   Tobacco Use   Smoking status: Never   Smokeless tobacco: Never  Substance Use Topics   Alcohol use: Yes    Alcohol/week: 0.0 standard drinks   Drug use: No     Allergies   Patient has no known allergies.   Review of Systems Review of Systems  Neurological:  Positive for headaches.  All other systems reviewed and are negative.   Physical Exam Triage Vital Signs ED Triage Vitals  Enc Vitals Group     BP 08/10/20 1711 (!) 155/98     Pulse Rate 08/10/20 1711 92     Resp 08/10/20 1711 18     Temp 08/10/20 1711 98.6 F (37 C)     Temp Source 08/10/20 1711 Oral     SpO2 08/10/20 1711 98 %     Weight --      Height --      Head Circumference --      Peak Flow --      Pain Score 08/10/20 1710 7     Pain Loc --      Pain Edu? --  Excl. in GC? --    No data found.  Updated Vital Signs BP (!) 155/98 (BP Location: Left Arm)   Pulse 92   Temp 98.6 F (37 C) (Oral)   Resp 18   LMP  (LMP Unknown)   SpO2 98%   Visual Acuity Right Eye Distance:   Left Eye Distance:   Bilateral Distance:    Right Eye Near:   Left Eye Near:    Bilateral Near:     Physical Exam Vitals and nursing note reviewed.  Constitutional:      General: She is not in acute distress.    Appearance: Normal appearance. She is not ill-appearing, toxic-appearing or diaphoretic.  HENT:     Head: Normocephalic and atraumatic.     Jaw: Tenderness (tenderness to left upper jaw, clicking when opening and closing jaw) present. No pain on movement.      Left Ear: Hearing, tympanic membrane, ear canal and external ear normal.  Eyes:     Conjunctiva/sclera: Conjunctivae normal.  Cardiovascular:     Rate and Rhythm: Normal rate.     Pulses: Normal pulses.      Heart sounds: Normal heart sounds.  Pulmonary:     Effort: Pulmonary effort is normal.     Breath sounds: Normal breath sounds.  Abdominal:     General: Abdomen is flat.  Musculoskeletal:        General: Normal range of motion.     Cervical back: Normal range of motion.  Skin:    General: Skin is warm and dry.  Neurological:     General: No focal deficit present.     Mental Status: She is alert and oriented to person, place, and time.     GCS: GCS eye subscore is 4. GCS verbal subscore is 5. GCS motor subscore is 6.  Psychiatric:        Mood and Affect: Mood normal.     UC Treatments / Results  Labs (all labs ordered are listed, but only abnormal results are displayed) Labs Reviewed - No data to display  EKG   Radiology No results found.  Procedures Procedures (including critical care time)  Medications Ordered in UC Medications - No data to display  Initial Impression / Assessment and Plan / UC Course  I have reviewed the triage vital signs and the nursing notes.  Pertinent labs & imaging results that were available during my care of the patient were reviewed by me and considered in my medical decision making (see chart for details).    Assessment negative for red flags or concerns, including intracranial abnormality.  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  Will treat with ibuprofen 800mg  three times a day for the next 3-5 days.  May apply heat or ice for comfort.  Strict ED follow up for any worsening symptoms.  Follow up with primary care as needed.   Final Clinical Impressions(s) / UC Diagnoses   Final diagnoses:  TMJ inflammation     Discharge Instructions      Take the ibuprofen three times a day for the next 3-5 days.    You can apply heat, ice, or alternate between heat and ice for comfort.    Return or go to the Emergency Department if symptoms worsen or do not improve in the next few days.      ED Prescriptions     Medication Sig Dispense Auth.  Provider   ibuprofen (ADVIL) 800 MG tablet Take 1 tablet (800 mg total) by mouth 3 (  three) times daily. 21 tablet Ivette Loyal, NP      PDMP not reviewed this encounter.   Ivette Loyal, NP 08/10/20 1914

## 2020-12-03 ENCOUNTER — Ambulatory Visit (INDEPENDENT_AMBULATORY_CARE_PROVIDER_SITE_OTHER): Payer: Medicare Other | Admitting: Podiatry

## 2020-12-03 ENCOUNTER — Other Ambulatory Visit: Payer: Self-pay | Admitting: Podiatry

## 2020-12-03 ENCOUNTER — Other Ambulatory Visit: Payer: Self-pay

## 2020-12-03 DIAGNOSIS — L600 Ingrowing nail: Secondary | ICD-10-CM | POA: Diagnosis not present

## 2020-12-03 DIAGNOSIS — B351 Tinea unguium: Secondary | ICD-10-CM

## 2020-12-03 MED ORDER — TERBINAFINE HCL 250 MG PO TABS
250.0000 mg | ORAL_TABLET | Freq: Every day | ORAL | 0 refills | Status: AC
Start: 2020-12-03 — End: ?

## 2020-12-03 NOTE — Progress Notes (Signed)
Subjective:   Patient ID: Terri Dickerson, female   DOB: 60 y.o.   MRN: [id number]   HPI Patient presents stating she is got significant thickness of nailbeds big toe second toe third right and also has a lot of skin manifestations with irritative type skin that becomes itchy and bothersome.  Patient states that this is been ongoing and that its been going on a number of years and she is interested in any kind of treatment we think would be helpful.  Patient does not smoke and is not currently active   Review of Systems  All other systems reviewed and are negative.      Objective:  Physical Exam Vitals and nursing note reviewed.  Constitutional:      Appearance: She is well-developed.  Pulmonary:     Effort: Pulmonary effort is normal.  Musculoskeletal:        General: Normal range of motion.  Skin:    General: Skin is warm.  Neurological:     Mental Status: She is alert.    Neurovascular status intact muscle strength is found to be mildly diminished but active with range of motion diminished subtalar midtarsal joint.  Patient is found to have discoloration of the foot right has structural bunion deformity bilateral and is found to have severely thickened nailbeds 1-3 of the right foot dystrophic mildly to moderately tender with good digital perfusion well oriented x3     Assessment:  Appears to be fungal nail infection with possibility of also trauma which may have occurred in his nailbeds along with mycotic skin infection     Plan:  H&P reviewed correction also discussed bunions.  At this point we will get a focus on the nails and I did discuss treatment options and oral medication was discussed and she wants this understanding risk.  She will take 1 pill a day for 90 days and I ordered liver function studies today and will evaluate them prior to her initiating treatment.  Encouraged her to call questions explained it will take about 6 months to make a difference and ultimately  she may require nail removal

## 2020-12-04 LAB — HEPATIC FUNCTION PANEL
ALT: 12 IU/L (ref 0–32)
AST: 13 IU/L (ref 0–40)
Albumin: 4.4 g/dL (ref 3.8–4.9)
Alkaline Phosphatase: 101 IU/L (ref 44–121)
Bilirubin Total: 0.6 mg/dL (ref 0.0–1.2)
Bilirubin, Direct: 0.2 mg/dL (ref 0.00–0.40)
Total Protein: 8.3 g/dL (ref 6.0–8.5)

## 2020-12-22 ENCOUNTER — Telehealth: Payer: Self-pay | Admitting: *Deleted

## 2020-12-22 ENCOUNTER — Ambulatory Visit: Payer: Medicare Other | Admitting: Podiatry

## 2020-12-22 NOTE — Telephone Encounter (Signed)
Patient is calling to cancel upcoming appointment for today@ 2:15, not feeling well, please call back to reschedule.

## 2021-01-01 ENCOUNTER — Ambulatory Visit (INDEPENDENT_AMBULATORY_CARE_PROVIDER_SITE_OTHER): Payer: Medicare Other | Admitting: Podiatry

## 2021-01-01 ENCOUNTER — Encounter: Payer: Self-pay | Admitting: Podiatry

## 2021-01-01 ENCOUNTER — Other Ambulatory Visit: Payer: Self-pay

## 2021-01-01 DIAGNOSIS — L600 Ingrowing nail: Secondary | ICD-10-CM

## 2021-01-01 NOTE — Progress Notes (Signed)
Subjective:   Patient ID: Terri Dickerson, female   DOB: 60 y.o.   MRN: [id number]   HPI Patient presents stating she had had a stroke a number of years ago and feels like she does not walk quite as normally on the left and just wanted it checked and she does not want to have to take medicine for the right and have any issues with complications and would rather have the nails removed that are damaged for second and third nails   ROS      Objective:  Physical Exam  Neurovascular status it is intact and when I checked the left foot it does relatively function normally is just mildly weak and the only alternative would be bracing which I do not think she needs and she is not interested in at this time.  For the right hallux second and third nails are very thick and dystrophic and moderately painful secondary to the structure of the nailbeds     Assessment:  Mild changes of the left foot but I do not think pathological and would not recommend bracing with damaged painful hallux nail second and third right     Plan:  H&P discussed condition and recommended removal of the nails on the right foot and explained procedure risk and allowed her to sign consent form.  She does understand these will be permanent procedures and today I went ahead and I infiltrated the right hallux second and third toe right foot with 180 mg Xylocaine Marcaine mixture I did standard prep of the toes and under sterile technique remove the hallux second and third nails exposed matrix and applied phenol 5 application to the hallux for application to the second and third toes and then applied sterile dressing and instructed on leaving these on 24 hours but taking them off earlier if throbbing were to occur and encouraged her to call with questions concerns during the healing.

## 2021-01-01 NOTE — Patient Instructions (Addendum)

## 2021-03-02 ENCOUNTER — Encounter: Payer: Self-pay | Admitting: Podiatry

## 2021-03-02 ENCOUNTER — Ambulatory Visit (INDEPENDENT_AMBULATORY_CARE_PROVIDER_SITE_OTHER): Payer: Medicare Other | Admitting: Podiatry

## 2021-03-02 ENCOUNTER — Other Ambulatory Visit: Payer: Self-pay

## 2021-03-02 DIAGNOSIS — L600 Ingrowing nail: Secondary | ICD-10-CM | POA: Diagnosis not present

## 2021-03-02 DIAGNOSIS — B351 Tinea unguium: Secondary | ICD-10-CM

## 2021-03-03 NOTE — Progress Notes (Signed)
Subjective:   Patient ID: Terri Dickerson, female   DOB: 61 y.o.   MRN: [id number]   HPI Patient presents concerned about discoloration of nailbeds and crusted tissue with history of having had nails removed with no current drainage   ROS      Objective:  Physical Exam  Neurovascular status intact history of incurvation of nail beds with several nails that are slightly incurvated now but the ones that were removed are doing well with scab-like tissue with slight discoloration of adjacent nails     Assessment:  Healing well from nail surgery right 1 2 and 3 with remaining nails having slight dystrophic changes mild ingrown     Plan:  Debrided tissue on the affected nails do not recommend removal of adjacent nails someday in future may be necessary

## 2021-08-26 ENCOUNTER — Encounter (HOSPITAL_COMMUNITY): Payer: Self-pay

## 2021-08-26 ENCOUNTER — Emergency Department (HOSPITAL_COMMUNITY): Payer: Medicare Other

## 2021-08-26 ENCOUNTER — Emergency Department (HOSPITAL_COMMUNITY)
Admission: EM | Admit: 2021-08-26 | Discharge: 2021-08-27 | Disposition: A | Discharge status: Home / Self Care | Payer: Medicare Other | Attending: Emergency Medicine | Admitting: Emergency Medicine

## 2021-08-26 ENCOUNTER — Other Ambulatory Visit: Payer: Self-pay

## 2021-08-26 DIAGNOSIS — Z7982 Long term (current) use of aspirin: Secondary | ICD-10-CM | POA: Insufficient documentation

## 2021-08-26 DIAGNOSIS — R079 Chest pain, unspecified: Secondary | ICD-10-CM | POA: Diagnosis not present

## 2021-08-26 LAB — BASIC METABOLIC PANEL
Anion gap: 10 (ref 5–15)
BUN: 26 mg/dL — ABNORMAL HIGH (ref 6–20)
CO2: 26 mmol/L (ref 22–32)
Calcium: 9.4 mg/dL (ref 8.9–10.3)
Chloride: 100 mmol/L (ref 98–111)
Creatinine, Ser: 1.33 mg/dL — ABNORMAL HIGH (ref 0.44–1.00)
GFR, Estimated: 46 mL/min — ABNORMAL LOW (ref >60)
Glucose, Bld: 109 mg/dL — ABNORMAL HIGH (ref 70–99)
Potassium: 3.5 mmol/L (ref 3.5–5.1)
Sodium: 136 mmol/L (ref 135–145)

## 2021-08-26 LAB — CBC
HCT: 33.5 % — ABNORMAL LOW (ref 36.0–46.0)
Hemoglobin: 11 g/dL — ABNORMAL LOW (ref 12.0–15.0)
MCH: 27.3 pg (ref 26.0–34.0)
MCHC: 32.8 g/dL (ref 30.0–36.0)
MCV: 83.1 fL (ref 80.0–100.0)
Platelets: 176 10*3/uL (ref 150–400)
RBC: 4.03 MIL/uL (ref 3.87–5.11)
RDW: 12.6 % (ref 11.5–15.5)
WBC: 7.9 10*3/uL (ref 4.0–10.5)
nRBC: 0 % (ref 0.0–0.2)

## 2021-08-26 LAB — TROPONIN I (HIGH SENSITIVITY): Troponin I (High Sensitivity): 3 ng/L (ref <18)

## 2021-08-26 NOTE — ED Provider Triage Note (Signed)
Emergency Medicine Provider Triage Evaluation Note  Terri Dickerson , a 61 y.o. female  was evaluated in triage.  Pt complains of substernal chest pain along with perhaps epigastric abdominal pain.  Reports episode began last night, had a double portion of a roast with onions and peppers.  Also reports waking up with the same symptoms.  Pain is alleviated with taking a deep breath.  No exacerbating factors.  Thought this was likely reflux or indigestion therefore she took a spoonful of mustard.  She has taken her blood pressure medication along with regular medication without anything for reflux.  Felt like she was can have an indigestion however feels somewhat different now.  No prior history of CAD, did have a prior stroke in 2018.  Review of Systems  Positive: Chest pain Negative: Sob, fever, leg swelling  Physical Exam  BP 139/84 (BP Location: Right Arm)   Pulse 92   Temp 98.3 F (36.8 C) (Oral)   Resp 20   LMP  (LMP Unknown)   SpO2 94%  Gen:   Awake, no distress   Resp:  Normal effort  MSK:   Moves extremities without difficulty  Other:    Medical Decision Making  Medically screening exam initiated at 9:17 PM.  Appropriate orders placed.  Terri Dickerson was informed that the remainder of the evaluation will be completed by another provider, this initial triage assessment does not replace that evaluation, and the importance of remaining in the ED until their evaluation is complete.     Claude Manges, PA-C 08/26/21 2122

## 2021-08-26 NOTE — ED Triage Notes (Signed)
PT reports substernal chest pain that gets better with taking a deep breath. Pain does not radiate, no exacerabating factors. Denies SOB. Pt  reports pain started after eating roast and onions and peppers last night.

## 2021-08-27 LAB — TROPONIN I (HIGH SENSITIVITY): Troponin I (High Sensitivity): 3 ng/L (ref <18)

## 2021-08-27 NOTE — Discharge Instructions (Signed)
If you develop recurrent, continued, or worsening chest pain, shortness of breath, fever, vomiting, abdominal or back pain, or any other new/concerning symptoms then return to the ER for evaluation.  

## 2021-08-27 NOTE — ED Provider Notes (Signed)
[facility] EMERGENCY DEPARTMENT Provider Note   CSN: [id number] Arrival date & time: 08/26/21  2052     History  Chief Complaint  Patient presents with   Chest Pain    Terri Dickerson is a 61 y.o. female.  HPI 61 year old female presents with chest pain.  Started on the morning of 8/9 when she woke up.  She thinks it was due to the roast she had the night before on 8/8.  Has had a sharp pain pushing in on the middle of her chest.  Pretty much constant though it seemed to get better with taking deep breaths.  She tried some mustard in case it was reflux and that did not help.  Home Medications Prior to Admission medications   Medication Sig Start Date End Date Taking? Authorizing Provider  amLODipine (NORVASC) 10 MG tablet Take 1 tablet (10 mg total) by mouth daily. 09/25/16   Sherren Mocha, MD  amLODipine (NORVASC) 5 MG tablet  10 mg = 2 tab(s), Tab, Oral, Daily, # 60 tab(s), Refill(s) 3, Print Requisition 05/07/16   [provider]  aspirin 81 MG chewable tablet CHEW ONE TABLET BY MOUTH DAILY 11/16/16   Sherren Mocha, MD  atorvastatin (LIPITOR) 20 MG tablet Take 20 mg by mouth at bedtime. Patient not taking: Reported on 12/03/2020 08/11/20   [provider]  atorvastatin (LIPITOR) 80 MG tablet Take 1 tablet (80 mg total) by mouth daily. 09/25/16   Sherren Mocha, MD  carvedilol (COREG) 12.5 MG tablet Take 1 tablet (12.5 mg total) by mouth 2 (two) times daily with a meal. 09/25/16   Sherren Mocha, MD  hydrochlorothiazide (HYDRODIURIL) 25 MG tablet Take 25 mg by mouth daily. 08/11/20   [provider]  ibuprofen (ADVIL) 800 MG tablet Take 1 tablet (800 mg total) by mouth 3 (three) times daily. 08/10/20   Ivette Loyal, NP  lisinopril-hydrochlorothiazide (PRINZIDE,ZESTORETIC) 20-25 MG tablet Take 1 tablet by mouth daily. Patient not taking: Reported on 12/03/2020 09/25/16   Sherren Mocha, MD  losartan (COZAAR) 100 MG tablet Take 100 mg by mouth daily.  11/28/20   [provider]  terbinafine (LAMISIL) 250 MG tablet Take 1 tablet (250 mg total) by mouth daily. 12/03/20   Lenn Sink, DPM      Allergies    Patient has no known allergies.    Review of Systems   Review of Systems  Respiratory:  Negative for shortness of breath.   Cardiovascular:  Positive for chest pain. Negative for leg swelling.  Gastrointestinal:  Negative for abdominal pain.  Musculoskeletal:  Negative for back pain.    Physical Exam Updated Vital Signs BP (!) 135/55 (BP Location: Left Arm)   Pulse 85   Temp 98 F (36.7 C) (Oral)   Resp 16   Ht 5\' 3"  (1.6 m)   Wt 103.5 kg   LMP  (LMP Unknown)   SpO2 100%   BMI 40.42 kg/m  Physical Exam Vitals and nursing note reviewed.  Constitutional:      General: She is not in acute distress.    Appearance: She is well-developed. She is obese. She is not ill-appearing or diaphoretic.  HENT:     Head: Normocephalic and atraumatic.  Cardiovascular:     Rate and Rhythm: Normal rate and regular rhythm.     Heart sounds: Normal heart sounds.  Pulmonary:     Effort: Pulmonary effort is normal.     Breath sounds:  Normal breath sounds.  Abdominal:     Palpations: Abdomen is soft.     Tenderness: There is no abdominal tenderness.  Skin:    General: Skin is warm and dry.  Neurological:     Mental Status: She is alert.     ED Results / Procedures / Treatments   Labs (all labs ordered are listed, but only abnormal results are displayed) Labs Reviewed  BASIC METABOLIC PANEL - Abnormal; Notable for the following components:      Result Value   Glucose, Bld 109 (*)    BUN 26 (*)    Creatinine, Ser 1.33 (*)    GFR, Estimated 46 (*)    All other components within normal limits  CBC - Abnormal; Notable for the following components:   Hemoglobin 11.0 (*)    HCT 33.5 (*)    All other components within normal limits  TROPONIN I (HIGH SENSITIVITY)  TROPONIN I (HIGH SENSITIVITY)    EKG EKG  Interpretation  Date/Time:  Wednesday August 26 2021 21:14:07 EDT Ventricular Rate:  88 PR Interval:  154 QRS Duration: 72 QT Interval:  340 QTC Calculation: 411 R Axis:   36 Text Interpretation: Normal sinus rhythm Low voltage QRS Cannot rule out Anterior infarct , age undetermined  T wave flattening similar to 2017 Confirmed by Pricilla Loveless [phone]) on 08/27/2021 8:11:37 AM  Radiology DG Chest 1 View  Result Date: 08/26/2021 CLINICAL DATA:  Chest pain. EXAM: CHEST  1 VIEW COMPARISON:  05/04/2015 FINDINGS: Lung volumes are low. Prominent heart size likely accentuated by low volume technique the cardiomediastinal contours are normal for technique. Pulmonary vasculature is normal. No consolidation, pleural effusion, or pneumothorax. Minimal scarring in the right upper lobe, unchanged. No acute osseous abnormalities are seen. IMPRESSION: Low lung volumes without acute abnormality. Electronically Signed   By: Narda Rutherford M.D.   On: 08/26/2021 21:55    Procedures Procedures    Medications Ordered in ED Medications - No data to display  ED Course/ Medical Decision Making/ A&P                           Medical Decision Making Amount and/or Complexity of Data Reviewed Labs:     Details: Troponin negative x 2.  Mild anemia compared to baseline.  Creatinine is mildly elevated Radiology: independent interpretation performed.    Details: No pneumonia ECG/medicine tests: independent interpretation performed.    Details: ECG with T wave flattening but no inversions or ST depressions/elevations to suspect ischemia   Patient presents with sharp chest pain in the middle of her chest.  Ongoing throughout all day on 8/9.  At some point while in the waiting room for 11 hours the pain seemed to resolve.  This is pretty atypical, no exertional component per patient.  Troponins are negative x 2 as above.  Doubt ACS.  She does have risk factors for cardiac disease but the presentation is so  atypical I think is reasonable to have her follow-up as an outpatient, which patient is agreeable to.  Creatinine and BUN are mildly bumped compared to baseline.  Patient states she has not drink well over the last 24 hours due to the pain.  Offered IV fluids but she would rather drink extra fluids over the next 24-48 hours or so.  I think this is pretty reasonable.  Otherwise, advised to follow-up with PCP.  Given return precautions.  Low suspicion for ACS, PE, dissection, esophageal perforation,  etc.        Final Clinical Impression(s) / ED Diagnoses Final diagnoses:  Nonspecific chest pain    Rx / DC Orders ED Discharge Orders     None         Pricilla Loveless, MD 08/27/21 [phone]

## 2021-10-29 DIAGNOSIS — Z0000 Encounter for general adult medical examination without abnormal findings: Secondary | ICD-10-CM | POA: Diagnosis not present

## 2021-10-29 DIAGNOSIS — Z8673 Personal history of transient ischemic attack (TIA), and cerebral infarction without residual deficits: Secondary | ICD-10-CM | POA: Diagnosis not present

## 2021-10-29 DIAGNOSIS — I10 Essential (primary) hypertension: Secondary | ICD-10-CM | POA: Diagnosis not present

## 2021-10-29 DIAGNOSIS — L989 Disorder of the skin and subcutaneous tissue, unspecified: Secondary | ICD-10-CM | POA: Diagnosis not present

## 2021-10-29 DIAGNOSIS — Z1231 Encounter for screening mammogram for malignant neoplasm of breast: Secondary | ICD-10-CM | POA: Diagnosis not present

## 2021-10-29 DIAGNOSIS — R7303 Prediabetes: Secondary | ICD-10-CM | POA: Diagnosis not present

## 2021-11-24 DIAGNOSIS — R7989 Other specified abnormal findings of blood chemistry: Secondary | ICD-10-CM | POA: Diagnosis not present

## 2021-11-24 DIAGNOSIS — R778 Other specified abnormalities of plasma proteins: Secondary | ICD-10-CM | POA: Diagnosis not present

## 2022-01-04 DIAGNOSIS — Z0000 Encounter for general adult medical examination without abnormal findings: Secondary | ICD-10-CM | POA: Diagnosis not present

## 2022-04-07 DIAGNOSIS — Z760 Encounter for issue of repeat prescription: Secondary | ICD-10-CM | POA: Diagnosis not present

## 2022-04-07 DIAGNOSIS — L02412 Cutaneous abscess of left axilla: Secondary | ICD-10-CM | POA: Diagnosis not present

## 2022-05-27 DIAGNOSIS — I10 Essential (primary) hypertension: Secondary | ICD-10-CM | POA: Diagnosis not present

## 2022-05-27 DIAGNOSIS — E782 Mixed hyperlipidemia: Secondary | ICD-10-CM | POA: Diagnosis not present

## 2022-05-27 DIAGNOSIS — R7303 Prediabetes: Secondary | ICD-10-CM | POA: Diagnosis not present

## 2022-06-15 DIAGNOSIS — R7989 Other specified abnormal findings of blood chemistry: Secondary | ICD-10-CM | POA: Diagnosis not present

## 2022-06-28 ENCOUNTER — Observation Stay (HOSPITAL_COMMUNITY)
Admission: EM | Admit: 2022-06-28 | Discharge: 2022-06-29 | Disposition: A | Discharge status: Home / Self Care | Payer: Medicare Other | Attending: Internal Medicine | Admitting: Internal Medicine

## 2022-06-28 ENCOUNTER — Emergency Department (HOSPITAL_COMMUNITY): Payer: Medicare Other

## 2022-06-28 ENCOUNTER — Other Ambulatory Visit: Payer: Self-pay

## 2022-06-28 ENCOUNTER — Observation Stay (HOSPITAL_COMMUNITY): Payer: Medicare Other

## 2022-06-28 DIAGNOSIS — Z79899 Other long term (current) drug therapy: Secondary | ICD-10-CM | POA: Insufficient documentation

## 2022-06-28 DIAGNOSIS — R519 Headache, unspecified: Secondary | ICD-10-CM | POA: Insufficient documentation

## 2022-06-28 DIAGNOSIS — I129 Hypertensive chronic kidney disease with stage 1 through stage 4 chronic kidney disease, or unspecified chronic kidney disease: Secondary | ICD-10-CM | POA: Insufficient documentation

## 2022-06-28 DIAGNOSIS — Z7982 Long term (current) use of aspirin: Secondary | ICD-10-CM | POA: Diagnosis not present

## 2022-06-28 DIAGNOSIS — Z8673 Personal history of transient ischemic attack (TIA), and cerebral infarction without residual deficits: Secondary | ICD-10-CM | POA: Insufficient documentation

## 2022-06-28 DIAGNOSIS — N179 Acute kidney failure, unspecified: Secondary | ICD-10-CM | POA: Diagnosis not present

## 2022-06-28 DIAGNOSIS — R109 Unspecified abdominal pain: Secondary | ICD-10-CM | POA: Diagnosis present

## 2022-06-28 DIAGNOSIS — N1832 Chronic kidney disease, stage 3b: Secondary | ICD-10-CM | POA: Insufficient documentation

## 2022-06-28 LAB — URINALYSIS, ROUTINE W REFLEX MICROSCOPIC
Bilirubin Urine: NEGATIVE
Glucose, UA: NEGATIVE mg/dL
Ketones, ur: NEGATIVE mg/dL
Leukocytes,Ua: NEGATIVE
Nitrite: NEGATIVE
Protein, ur: NEGATIVE mg/dL
Specific Gravity, Urine: 1.006 (ref 1.005–1.030)
pH: 5 (ref 5.0–8.0)

## 2022-06-28 LAB — CBC
HCT: 34.5 % — ABNORMAL LOW (ref 36.0–46.0)
Hemoglobin: 11 g/dL — ABNORMAL LOW (ref 12.0–15.0)
MCH: 26.5 pg (ref 26.0–34.0)
MCHC: 31.9 g/dL (ref 30.0–36.0)
MCV: 83.1 fL (ref 80.0–100.0)
Platelets: 187 10*3/uL (ref 150–400)
RBC: 4.15 MIL/uL (ref 3.87–5.11)
RDW: 13 % (ref 11.5–15.5)
WBC: 9.2 10*3/uL (ref 4.0–10.5)
nRBC: 0 % (ref 0.0–0.2)

## 2022-06-28 LAB — COMPREHENSIVE METABOLIC PANEL
ALT: 11 U/L (ref 0–44)
AST: 14 U/L — ABNORMAL LOW (ref 15–41)
Albumin: 3.7 g/dL (ref 3.5–5.0)
Alkaline Phosphatase: 85 U/L (ref 38–126)
Anion gap: 12 (ref 5–15)
BUN: 30 mg/dL — ABNORMAL HIGH (ref 6–20)
CO2: 26 mmol/L (ref 22–32)
Calcium: 9.5 mg/dL (ref 8.9–10.3)
Chloride: 97 mmol/L — ABNORMAL LOW (ref 98–111)
Creatinine, Ser: 2.24 mg/dL — ABNORMAL HIGH (ref 0.44–1.00)
GFR, Estimated: 25 mL/min — ABNORMAL LOW (ref >60)
Glucose, Bld: 121 mg/dL — ABNORMAL HIGH (ref 70–99)
Potassium: 3.6 mmol/L (ref 3.5–5.1)
Sodium: 135 mmol/L (ref 135–145)
Total Bilirubin: 0.7 mg/dL (ref 0.3–1.2)
Total Protein: 8.4 g/dL — ABNORMAL HIGH (ref 6.5–8.1)

## 2022-06-28 LAB — LIPASE, BLOOD: Lipase: 29 U/L (ref 11–51)

## 2022-06-28 MED ORDER — ASPIRIN 81 MG PO CHEW
81.0000 mg | CHEWABLE_TABLET | Freq: Every day | ORAL | Status: DC
  Administered 2022-06-28 – 2022-06-29 (×2): 81 mg via ORAL
  Filled 2022-06-28 (×2): qty 1

## 2022-06-28 MED ORDER — ATORVASTATIN CALCIUM 80 MG PO TABS
80.0000 mg | ORAL_TABLET | Freq: Every day | ORAL | Status: DC
  Administered 2022-06-28 – 2022-06-29 (×2): 80 mg via ORAL
  Filled 2022-06-28 (×2): qty 1

## 2022-06-28 MED ORDER — PROCHLORPERAZINE EDISYLATE 10 MG/2ML IJ SOLN: 5.0000 mg | Freq: Four times a day (QID) | INTRAMUSCULAR | Status: DC | PRN

## 2022-06-28 MED ORDER — LACTATED RINGERS IV SOLN: INTRAVENOUS | Status: DC

## 2022-06-28 MED ORDER — CARVEDILOL 12.5 MG PO TABS
12.5000 mg | ORAL_TABLET | Freq: Two times a day (BID) | ORAL | Status: DC
  Administered 2022-06-28 – 2022-06-29 (×2): 12.5 mg via ORAL
  Filled 2022-06-28 (×2): qty 1

## 2022-06-28 MED ORDER — POLYETHYLENE GLYCOL 3350 17 G PO PACK: 17.0000 g | PACK | Freq: Every day | ORAL | Status: DC | PRN

## 2022-06-28 MED ORDER — SODIUM CHLORIDE 0.9 % IV BOLUS: 500.0000 mL | Freq: Once | INTRAVENOUS | Status: DC

## 2022-06-28 MED ORDER — AMLODIPINE BESYLATE 10 MG PO TABS
10.0000 mg | ORAL_TABLET | Freq: Every day | ORAL | Status: DC
  Administered 2022-06-28 – 2022-06-29 (×2): 10 mg via ORAL
  Filled 2022-06-28 (×2): qty 1

## 2022-06-28 MED ORDER — SODIUM CHLORIDE 0.9 % IV SOLN: INTRAVENOUS | Status: DC

## 2022-06-28 MED ORDER — ACETAMINOPHEN 325 MG PO TABS: 650.0000 mg | ORAL_TABLET | Freq: Four times a day (QID) | ORAL | Status: DC | PRN

## 2022-06-28 MED ORDER — SENNOSIDES-DOCUSATE SODIUM 8.6-50 MG PO TABS
2.0000 | ORAL_TABLET | Freq: Two times a day (BID) | ORAL | Status: DC
  Administered 2022-06-28 – 2022-06-29 (×3): 2 via ORAL
  Filled 2022-06-28 (×3): qty 2

## 2022-06-28 MED ORDER — MELATONIN 5 MG PO TABS: 5.0000 mg | ORAL_TABLET | Freq: Every evening | ORAL | Status: DC | PRN

## 2022-06-28 MED ORDER — ENOXAPARIN SODIUM 40 MG/0.4ML IJ SOSY
40.0000 mg | PREFILLED_SYRINGE | INTRAMUSCULAR | Status: DC
  Administered 2022-06-28: 40 mg via SUBCUTANEOUS
  Filled 2022-06-28: qty 0.4

## 2022-06-28 MED ORDER — SODIUM CHLORIDE 0.9 % IV BOLUS
1000.0000 mL | Freq: Once | INTRAVENOUS | Status: AC
Start: 2022-06-28 — End: 2022-06-28
  Administered 2022-06-28: 1000 mL via INTRAVENOUS

## 2022-06-28 NOTE — ED Provider Notes (Signed)
[place] EMERGENCY DEPARTMENT AT [facility] Provider Note   CSN: [id number] Arrival date & time: 06/28/22  0111     History  Chief Complaint  Patient presents with   Abdominal Pain    CABRINI WINBERRY is a 62 y.o. female.  The history is provided by the patient and medical records.  Abdominal Pain IZZIBELLA BARFKNECHT is a 62 y.o. female who presents to the Emergency Department complaining of abdominal discomfort, headache and dizziness.  She states that her dizziness started about 1 week ago with associated headache located throughout the top of her head.  She does feel tight on her left face for a few seconds, 3 episodes, this is now resolved.  Her dizziness is described as lightheadedness.  She also reports 2 weeks of feeling upset stomach.  She feels like there is a tight band across her upper abdomen and she feels like she is not digesting well and that keeps her from sleeping.  She has nausea but no vomiting.  She has had some loose stool at times but none significantly.  No dysuria.  She is eating and drinking.  She feels like her heart is beating fast since Friday.  No chest pain.   No leg swelling/pain.  No fever.  Hx/o pre DM, HTN.  Takes losartan and HCTZ.     Home Medications Prior to Admission medications   Medication Sig Start Date End Date Taking? Authorizing Provider  amLODipine (NORVASC) 10 MG tablet Take 1 tablet (10 mg total) by mouth daily. 09/25/16   Sherren Mocha, MD  amLODipine (NORVASC) 5 MG tablet  10 mg = 2 tab(s), Tab, Oral, Daily, # 60 tab(s), Refill(s) 3, Print Requisition 05/07/16   [provider]  aspirin 81 MG chewable tablet CHEW ONE TABLET BY MOUTH DAILY 11/16/16   Sherren Mocha, MD  atorvastatin (LIPITOR) 20 MG tablet Take 20 mg by mouth at bedtime. Patient not taking: Reported on 12/03/2020 08/11/20   [provider]  atorvastatin (LIPITOR) 80 MG tablet Take 1 tablet (80 mg total) by mouth daily. 09/25/16   Sherren Mocha, MD   carvedilol (COREG) 12.5 MG tablet Take 1 tablet (12.5 mg total) by mouth 2 (two) times daily with a meal. 09/25/16   Sherren Mocha, MD  hydrochlorothiazide (HYDRODIURIL) 25 MG tablet Take 25 mg by mouth daily. 08/11/20   [provider]  ibuprofen (ADVIL) 800 MG tablet Take 1 tablet (800 mg total) by mouth 3 (three) times daily. 08/10/20   Ivette Loyal, NP  lisinopril-hydrochlorothiazide (PRINZIDE,ZESTORETIC) 20-25 MG tablet Take 1 tablet by mouth daily. Patient not taking: Reported on 12/03/2020 09/25/16   Sherren Mocha, MD  losartan (COZAAR) 100 MG tablet Take 100 mg by mouth daily. 11/28/20   [provider]  terbinafine (LAMISIL) 250 MG tablet Take 1 tablet (250 mg total) by mouth daily. 12/03/20   Lenn Sink, DPM      Allergies    Patient has no known allergies.    Review of Systems   Review of Systems  Gastrointestinal:  Positive for abdominal pain.  All other systems reviewed and are negative.   Physical Exam Updated Vital Signs BP 117/65   Pulse 86   Temp 98.3 F (36.8 C)   Resp 16   Ht 5\' 3"  (1.6 m)   Wt 106.1 kg   LMP  (LMP Unknown)   SpO2 100%   BMI 41.45 kg/m  Physical Exam Vitals and nursing  note reviewed.  Constitutional:      Appearance: She is well-developed.  HENT:     Head: Normocephalic and atraumatic.  Cardiovascular:     Rate and Rhythm: Normal rate and regular rhythm.     Heart sounds: No murmur heard. Pulmonary:     Effort: Pulmonary effort is normal. No respiratory distress.     Breath sounds: Normal breath sounds.  Abdominal:     Palpations: Abdomen is soft.     Tenderness: There is no abdominal tenderness. There is no guarding or rebound.  Musculoskeletal:        General: No tenderness.  Skin:    General: Skin is warm and dry.  Neurological:     Mental Status: She is alert and oriented to person, place, and time.     Comments: No asymmetry of facial movements.  Visual fields grossly intact.  5 out of 5 strength in all 4  extremities.  Psychiatric:        Behavior: Behavior normal.     ED Results / Procedures / Treatments   Labs (all labs ordered are listed, but only abnormal results are displayed) Labs Reviewed  COMPREHENSIVE METABOLIC PANEL - Abnormal; Notable for the following components:      Result Value   Chloride 97 (*)    Glucose, Bld 121 (*)    BUN 30 (*)    Creatinine, Ser 2.24 (*)    Total Protein 8.4 (*)    AST 14 (*)    GFR, Estimated 25 (*)    All other components within normal limits  CBC - Abnormal; Notable for the following components:   Hemoglobin 11.0 (*)    HCT 34.5 (*)    All other components within normal limits  URINALYSIS, ROUTINE W REFLEX MICROSCOPIC - Abnormal; Notable for the following components:   APPearance HAZY (*)    Hgb urine dipstick SMALL (*)    Bacteria, UA MANY (*)    All other components within normal limits  LIPASE, BLOOD    EKG EKG Interpretation  Date/Time:  Monday June 28 2022 01:37:21 EDT Ventricular Rate:  82 PR Interval:  162 QRS Duration: 80 QT Interval:  376 QTC Calculation: 439 R Axis:   10 Text Interpretation: Normal sinus rhythm Low voltage QRS Nonspecific ST and T wave abnormality Abnormal ECG Confirmed by Tilden Fossa [phone]) on 06/28/2022 1:54:43 AM  Radiology CT ABDOMEN PELVIS WO CONTRAST  Result Date: 06/28/2022 CLINICAL DATA:  Abdominal pain, acute, nonlocalized EXAM: CT ABDOMEN AND PELVIS WITHOUT CONTRAST TECHNIQUE: Multidetector CT imaging of the abdomen and pelvis was performed following the standard protocol without IV contrast. RADIATION DOSE REDUCTION: This exam was performed according to the departmental dose-optimization program which includes automated exposure control, adjustment of the mA and/or kV according to patient size and/or use of iterative reconstruction technique. COMPARISON:  None Available. FINDINGS: Lower chest: No acute abnormality. Hepatobiliary: No focal liver abnormality is seen. No gallstones,  gallbladder wall thickening, or biliary dilatation. Pancreas: Unremarkable Spleen: Unremarkable Adrenals/Urinary Tract: Adrenal glands are unremarkable. Kidneys are normal, without renal calculi, focal lesion, or hydronephrosis. Bladder is unremarkable. Stomach/Bowel: Mild sigmoid diverticulosis. Stomach, small bowel, and large bowel are otherwise unremarkable. Appendix normal. No free intraperitoneal gas or fluid. Vascular/Lymphatic: Moderate aortoiliac atherosclerotic calcification. No aortic aneurysm. No pathologic adenopathy within the abdomen and pelvis. Reproductive: Involuted calcified uterine fibroid noted. The pelvic organs are otherwise unremarkable. Other: No abdominal wall hernia or abnormality. No abdominopelvic ascites. Musculoskeletal: Bilateral L5 pars defects are present  with grade 1 anterolisthesis L5-S1. No acute bone abnormality. No lytic or blastic bone lesion. IMPRESSION: 1. No acute intra-abdominal pathology identified. No definite radiographic explanation for the patient's reported symptoms. 2. Mild sigmoid diverticulosis without superimposed acute inflammatory change. 3. Bilateral L5 pars defects with grade 1 anterolisthesis L5-S1. Aortic Atherosclerosis (ICD10-I70.0). Electronically Signed   By: Helyn Numbers M.D.   On: 06/28/2022 03:52   CT Head Wo Contrast  Result Date: 06/28/2022 CLINICAL DATA:  Headache to the left side of face EXAM: CT HEAD WITHOUT CONTRAST TECHNIQUE: Contiguous axial images were obtained from the base of the skull through the vertex without intravenous contrast. RADIATION DOSE REDUCTION: This exam was performed according to the departmental dose-optimization program which includes automated exposure control, adjustment of the mA and/or kV according to patient size and/or use of iterative reconstruction technique. COMPARISON:  None Available. FINDINGS: Brain: No intracranial hemorrhage, mass effect, or evidence of acute infarct. No hydrocephalus. No extra-axial  fluid collection. Ill-defined hypoattenuation within the cerebral white matter is nonspecific but consistent with chronic small vessel ischemic disease. Chronic left cerebellar infarct Vascular: No hyperdense vessel. Intracranial arterial calcification. Skull: No fracture or focal lesion. Sinuses/Orbits: No acute finding. Paranasal sinuses and mastoid air cells are well aerated. Other: None. IMPRESSION: 1. No evidence of acute intracranial abnormality. 2. Chronic small vessel ischemic disease. Chronic left cerebellar infarct. Electronically Signed   By: Minerva Fester M.D.   On: 06/28/2022 03:48    Procedures Procedures    Medications Ordered in ED Medications  enoxaparin (LOVENOX) injection 40 mg (has no administration in time range)  lactated ringers infusion (has no administration in time range)  aspirin chewable tablet 81 mg (has no administration in time range)  atorvastatin (LIPITOR) tablet 80 mg (has no administration in time range)  acetaminophen (TYLENOL) tablet 650 mg (has no administration in time range)  prochlorperazine (COMPAZINE) injection 5 mg (has no administration in time range)  melatonin tablet 5 mg (has no administration in time range)  polyethylene glycol (MIRALAX / GLYCOLAX) packet 17 g (has no administration in time range)  senna-docusate (Senokot-S) tablet 2 tablet (has no administration in time range)  sodium chloride 0.9 % bolus 1,000 mL (0 mLs Intravenous Stopped 06/28/22 0500)    ED Course/ Medical Decision Making/ A&P                             Medical Decision Making Amount and/or Complexity of Data Reviewed Labs: ordered. Radiology: ordered.  Risk Decision regarding hospitalization.   Patient here for evaluation of abdominal bloating, dizziness and headache.  She is nontoxic-appearing on evaluation with no focal neurologic deficits.  Labs significant for AKI with increasing creatinine from 1.4 on May 9 to 2.24 today.  UA does have bacteria present,  but is not clinically consistent with UTI given her symptoms.  Her blood pressure is in the 1 teens in the emergency department.  Patient states this is low for her and she typically runs in the 130s to 40s.  CT head and abdomen pelvis were obtained given her symptoms, and they are negative for acute abnormality.  Discussed with patient findings of studies.  Suspect some of her symptoms may be related to her acute kidney injury.  She may need adjustments and titrations of her blood pressure medications.  She was treated with IV fluid medications.  Medicine consulted for observation admission given her AKI and symptoms.  Current clinical picture is not  consistent with CVA.        Final Clinical Impression(s) / ED Diagnoses Final diagnoses:  AKI (acute kidney injury) [facility])    Rx / DC Orders ED Discharge Orders     None         Tilden Fossa, MD 06/28/22 [phone]

## 2022-06-28 NOTE — ED Triage Notes (Signed)
Patient coming to ED for evaluation of abdominal pain.  C/o generalized abdominal.  Having loose stool and "maybe constipation."  Reports she has been having intermittent HA, tightness to L side of face, and possible palpations.  Symptoms have been intermittent x "weeks."

## 2022-06-28 NOTE — Progress Notes (Signed)
Terri Dickerson is a 62 y.o. female with medical history significant for prior CVA in 2018, hypertension, hyperlipidemia, CKD 3B, who presented to [facility] ED from home due to complaints of intermittent dizziness and intermittent abdominal bloating and cramping for the past few weeks.  She has been admitted for AKI on CKD stage IIIb that appears to be prerenal in the setting of dehydration from poor oral intake and use of home HCTZ.  Patient has been seen and evaluated at bedside and has been admitted after midnight.  Plan to continue IV fluid with normal saline and recheck a.m. labs.  She states that she does not have much of an appetite at the moment and continues to have some abdominal bloating, but is slowly improving.  Anticipate discharge in a.m. if tolerating diet and has improvement in creatinine levels.  A.m. labs ordered.  Total care time: 20 minutes.

## 2022-06-28 NOTE — H&P (Addendum)
History and Physical  [name]:[id number] DOB: 02-Nov-1960 DOA: 06/28/2022  Referring physician: Dr. Madilyn Hook, EDP  PCP: Ollen Bowl, MD  Outpatient Specialists: Neurology Patient coming from: Home.  Chief Complaint: Intermittent dizziness and intermittent abdominal bloating and cramping.  HPI: Terri Dickerson is a 62 y.o. female with medical history significant for prior CVA in 2018, hypertension, hyperlipidemia, CKD 3B, who presented to [facility] ED from home due to complaints of intermittent dizziness and intermittent abdominal bloating and cramping for the past few weeks.  Associated with mild constipation, no bowel movement in 2 to 3 days.  No nausea or vomiting.  Also endorses intermittent headache.  In the ED, vital signs are stable.  CT head was nonacute.  CT abdomen pelvis without contrast revealed the following findings: 1. No acute intra-abdominal pathology identified. No definite radiographic explanation for the patient's reported symptoms. 2. Mild sigmoid diverticulosis without superimposed acute inflammatory change. 3. Bilateral L5 pars defects with grade 1 anterolisthesis L5-S1.  Aortic Atherosclerosis (ICD10-I70.0).  Lab studies were remarkable for elevated creatinine above baseline.  Due to concern for AKI EDP requested admission.  UA was negative for proteinuria.  Started on IV fluid hydration.  Admitted by [facility], hospitalist service.  ED Course: Temperature 98.3.  BP 117/65, pulse 72, respiratory 13, saturation 99% on room air.  Lab studies remarkable for serum glucose 121, BUN 30, creatinine 2.24, GFR 25.  Hemoglobin 11.0 at baseline.  WBC 9.2, platelet 187.  Review of Systems: Review of systems as noted in the HPI. All other systems reviewed and are negative.   Past Medical History:  Diagnosis Date   Hyperlipidemia    Hypertension    Stroke [facility])    No past surgical history on file.  Social History:  reports that she has never smoked. She has never  used smokeless tobacco. She reports current alcohol use. She reports that she does not use drugs.   No Known Allergies  Family History  Problem Relation Age of Onset   Hypertension Mother    Hyperlipidemia Mother       Prior to Admission medications   Medication Sig Start Date End Date Taking? Authorizing Provider  amLODipine (NORVASC) 10 MG tablet Take 1 tablet (10 mg total) by mouth daily. 09/25/16   Sherren Mocha, MD  amLODipine (NORVASC) 5 MG tablet  10 mg = 2 tab(s), Tab, Oral, Daily, # 60 tab(s), Refill(s) 3, Print Requisition 05/07/16   [provider]  aspirin 81 MG chewable tablet CHEW ONE TABLET BY MOUTH DAILY 11/16/16   Sherren Mocha, MD  atorvastatin (LIPITOR) 20 MG tablet Take 20 mg by mouth at bedtime. Patient not taking: Reported on 12/03/2020 08/11/20   [provider]  atorvastatin (LIPITOR) 80 MG tablet Take 1 tablet (80 mg total) by mouth daily. 09/25/16   Sherren Mocha, MD  carvedilol (COREG) 12.5 MG tablet Take 1 tablet (12.5 mg total) by mouth 2 (two) times daily with a meal. 09/25/16   Sherren Mocha, MD  hydrochlorothiazide (HYDRODIURIL) 25 MG tablet Take 25 mg by mouth daily. 08/11/20   [provider]  ibuprofen (ADVIL) 800 MG tablet Take 1 tablet (800 mg total) by mouth 3 (three) times daily. 08/10/20   Ivette Loyal, NP  lisinopril-hydrochlorothiazide (PRINZIDE,ZESTORETIC) 20-25 MG tablet Take 1 tablet by mouth daily. Patient not taking: Reported on 12/03/2020 09/25/16   Sherren Mocha, MD  losartan (COZAAR) 100 MG tablet Take 100 mg by mouth daily. 11/28/20  [provider]  terbinafine (LAMISIL) 250 MG tablet Take 1 tablet (250 mg total) by mouth daily. 12/03/20   Lenn Sink, DPM    Physical Exam: BP 117/65   Pulse 72   Temp 98.3 F (36.8 C)   Resp 13   Ht 5\' 3"  (1.6 m)   Wt 106.1 kg   LMP  (LMP Unknown)   SpO2 99%   BMI 41.45 kg/m   General: 62 y.o. year-old female well developed well nourished in no acute distress.  Alert  and oriented x3. Cardiovascular: Regular rate and rhythm with no rubs or gallops.  No thyromegaly or JVD noted.  No lower extremity edema. 2/4 pulses in all 4 extremities. Respiratory: Clear to auscultation with no wheezes or rales. Good inspiratory effort. Abdomen: Soft obese nontender nondistended with normal bowel sounds x4 quadrants. Muskuloskeletal: No cyanosis, clubbing or edema noted bilaterally Neuro: CN II-XII intact, strength, sensation, reflexes Skin: No ulcerative lesions noted or rashes Psychiatry: Judgement and insight appear normal. Mood is appropriate for condition and setting          Labs on Admission:  Basic Metabolic Panel: Recent Labs  Lab 06/28/22 0146  NA 135  K 3.6  CL 97*  CO2 26  GLUCOSE 121*  BUN 30*  CREATININE 2.24*  CALCIUM 9.5   Liver Function Tests: Recent Labs  Lab 06/28/22 0146  AST 14*  ALT 11  ALKPHOS 85  BILITOT 0.7  PROT 8.4*  ALBUMIN 3.7   Recent Labs  Lab 06/28/22 0146  LIPASE 29   No results for input(s): "AMMONIA" in the last 168 hours. CBC: Recent Labs  Lab 06/28/22 0146  WBC 9.2  HGB 11.0*  HCT 34.5*  MCV 83.1  PLT 187   Cardiac Enzymes: No results for input(s): "CKTOTAL", "CKMB", "CKMBINDEX", "TROPONINI" in the last 168 hours.  BNP (last 3 results) No results for input(s): "BNP" in the last 8760 hours.  ProBNP (last 3 results) No results for input(s): "PROBNP" in the last 8760 hours.  CBG: No results for input(s): "GLUCAP" in the last 168 hours.  Radiological Exams on Admission: CT ABDOMEN PELVIS WO CONTRAST  Result Date: 06/28/2022 CLINICAL DATA:  Abdominal pain, acute, nonlocalized EXAM: CT ABDOMEN AND PELVIS WITHOUT CONTRAST TECHNIQUE: Multidetector CT imaging of the abdomen and pelvis was performed following the standard protocol without IV contrast. RADIATION DOSE REDUCTION: This exam was performed according to the departmental dose-optimization program which includes automated exposure control,  adjustment of the mA and/or kV according to patient size and/or use of iterative reconstruction technique. COMPARISON:  None Available. FINDINGS: Lower chest: No acute abnormality. Hepatobiliary: No focal liver abnormality is seen. No gallstones, gallbladder wall thickening, or biliary dilatation. Pancreas: Unremarkable Spleen: Unremarkable Adrenals/Urinary Tract: Adrenal glands are unremarkable. Kidneys are normal, without renal calculi, focal lesion, or hydronephrosis. Bladder is unremarkable. Stomach/Bowel: Mild sigmoid diverticulosis. Stomach, small bowel, and large bowel are otherwise unremarkable. Appendix normal. No free intraperitoneal gas or fluid. Vascular/Lymphatic: Moderate aortoiliac atherosclerotic calcification. No aortic aneurysm. No pathologic adenopathy within the abdomen and pelvis. Reproductive: Involuted calcified uterine fibroid noted. The pelvic organs are otherwise unremarkable. Other: No abdominal wall hernia or abnormality. No abdominopelvic ascites. Musculoskeletal: Bilateral L5 pars defects are present with grade 1 anterolisthesis L5-S1. No acute bone abnormality. No lytic or blastic bone lesion. IMPRESSION: 1. No acute intra-abdominal pathology identified. No definite radiographic explanation for the patient's reported symptoms. 2. Mild sigmoid diverticulosis without superimposed acute inflammatory change. 3. Bilateral L5 pars defects with  grade 1 anterolisthesis L5-S1. Aortic Atherosclerosis (ICD10-I70.0). Electronically Signed   By: Helyn Numbers M.D.   On: 06/28/2022 03:52   CT Head Wo Contrast  Result Date: 06/28/2022 CLINICAL DATA:  Headache to the left side of face EXAM: CT HEAD WITHOUT CONTRAST TECHNIQUE: Contiguous axial images were obtained from the base of the skull through the vertex without intravenous contrast. RADIATION DOSE REDUCTION: This exam was performed according to the departmental dose-optimization program which includes automated exposure control, adjustment  of the mA and/or kV according to patient size and/or use of iterative reconstruction technique. COMPARISON:  None Available. FINDINGS: Brain: No intracranial hemorrhage, mass effect, or evidence of acute infarct. No hydrocephalus. No extra-axial fluid collection. Ill-defined hypoattenuation within the cerebral white matter is nonspecific but consistent with chronic small vessel ischemic disease. Chronic left cerebellar infarct Vascular: No hyperdense vessel. Intracranial arterial calcification. Skull: No fracture or focal lesion. Sinuses/Orbits: No acute finding. Paranasal sinuses and mastoid air cells are well aerated. Other: None. IMPRESSION: 1. No evidence of acute intracranial abnormality. 2. Chronic small vessel ischemic disease. Chronic left cerebellar infarct. Electronically Signed   By: Minerva Fester M.D.   On: 06/28/2022 03:48    EKG: I independently viewed the EKG done and my findings are as followed: Normal sinus rhythm 82.  Nonspecific ST-T changes.  QTc 439.  Assessment/Plan Present on Admission:  AKI (acute kidney injury) (HCC)  Principal Problem:   AKI (acute kidney injury) (HCC)  AKI on CKD 3B, suspect prerenal secondary to dehydration from poor intake and home diuretics Baseline creatinine appears to be 1.3 with GFR 46 Presented with creatinine of 2.24 with GFR of 25 Received 1 L IV fluid bolus NS in the ED UA was negative for proteinuria Obtain renal ultrasound Continue IV fluid hydration LR 50 cc/h x 1 day Monitor urine output and avoid nephrotoxic agents, NSAIDs, dehydration and hypotension Hold off home losartan/HCTZ for now Restart home Coreg and amlodipine if blood pressure allows, BPs are currently soft Repeat BMP in the morning  Abdominal cramping, constipation Start bowel regimen  Headache CT head nonacute Supportive care As needed analgesics  Hypertension The patient is on 4 oral antihypertensives including a diuretic, HCTZ Blood pressures are currently  soft Hold off oral antihypertensives until blood pressure improves. Closely monitor vital signs  Hyperlipidemia Resume home Lipitor  History of CVA in 2018 Minimal residual from her CVA, previously with left-sided weakness. Resume home aspirin and atorvastatin.  History of prediabetes With last hemoglobin A1c 5.8 in 2018. Mild hyperglycemia with serum glucose 121 Monitor for now   DVT prophylaxis: Subcu Lovenox daily  Code Status: Full code  Family Communication: None at bedside.  Disposition Plan: Admitted to telemetry medical unit  Consults called: None.  Admission status: Observation status.   Status is: Observation    Darlin Drop MD [facility] Pager [phone]  If 7PM-7AM, please contact night-coverage [email or web address] Password [facility]  06/28/2022, 5:13 AM

## 2022-06-28 NOTE — ED Notes (Signed)
ED TO INPATIENT HANDOFF REPORT  ED Nurse Name and Phone #: Terri Dickerson A. Terri Goosby, RN [phone]  S Name/Age/Gender Terri Dickerson 62 y.o. female Room/Bed: 019C/019C  Code Status   Code Status: Full Code  Home/SNF/Other Home Patient oriented to: self, place, time, and situation Is this baseline? Yes   Triage Complete: Triage complete  Chief Complaint AKI (acute kidney injury) [facility]) [N17.9]  Triage Note Patient coming to ED for evaluation of abdominal pain.  C/o generalized abdominal.  Having loose stool and "maybe constipation."  Reports she has been having intermittent HA, tightness to L side of face, and possible palpations.  Symptoms have been intermittent x "weeks."   Allergies No Known Allergies  Level of Care/Admitting Diagnosis ED Disposition     ED Disposition  Admit   Condition  --   Comment  Hospital Area: [facility] [[id number]]  Level of Care: Telemetry Medical [104]  May place patient in observation at [facility] or [facility] if equivalent level of care is available:: Yes  Covid Evaluation: Asymptomatic - no recent exposure (last 10 days) testing not required  Diagnosis: AKI (acute kidney injury) [facility]) [[id number]]  Admitting Physician: Terri Dickerson [[id number]]  Attending Physician: Terri Dickerson [[id number]]          B Medical/Surgery History Past Medical History:  Diagnosis Date   Hyperlipidemia    Hypertension    Stroke (HCC)    No past surgical history on file.   A IV Location/Drains/Wounds Patient Lines/Drains/Airways Status     Active Line/Drains/Airways     Name Placement date Placement time Site Days   Peripheral IV 06/28/22 20 G 1.16" Left Antecubital 06/28/22  0311  Antecubital  less than 1            Intake/Output Last 24 hours No intake or output data in the 24 hours ending 06/28/22 0506  Labs/Imaging Results for orders placed or performed during the hospital encounter of 06/28/22 (from the past 48 hour(s))   Lipase, blood     Status: None   Collection Time: 06/28/22  1:46 AM  Result Value Ref Range   Lipase 29 11 - 51 U/L    Comment: Performed at [facility], [address] [place], [place] [address]  Comprehensive metabolic panel     Status: Abnormal   Collection Time: 06/28/22  1:46 AM  Result Value Ref Range   Sodium 135 135 - 145 mmol/L   Potassium 3.6 3.5 - 5.1 mmol/L   Chloride 97 (L) 98 - 111 mmol/L   CO2 26 22 - 32 mmol/L   Glucose, Bld 121 (H) 70 - 99 mg/dL    Comment: Glucose reference range applies only to samples taken after fasting for at least 8 hours.   BUN 30 (H) 6 - 20 mg/dL   Creatinine, Ser 8.75 (H) 0.44 - 1.00 mg/dL   Calcium 9.5 8.9 - 64.3 mg/dL   Total Protein 8.4 (H) 6.5 - 8.1 g/dL   Albumin 3.7 3.5 - 5.0 g/dL   AST 14 (L) 15 - 41 U/L   ALT 11 0 - 44 U/L   Alkaline Phosphatase 85 38 - 126 U/L   Total Bilirubin 0.7 0.3 - 1.2 mg/dL   GFR, Estimated 25 (L) >60 mL/min    Comment: (NOTE) Calculated using the CKD-EPI Creatinine Equation (2021)    Anion gap 12 5 - 15    Comment: Performed at [facility], [address] [place],  [place] [address]  CBC     Status: Abnormal   Collection Time: 06/28/22  1:46 AM  Result Value Ref Range   WBC 9.2 4.0 - 10.5 K/uL   RBC 4.15 3.87 - 5.11 MIL/uL   Hemoglobin 11.0 (L) 12.0 - 15.0 g/dL   HCT 60.4 (L) 54.0 - 98.1 %   MCV 83.1 80.0 - 100.0 fL   MCH 26.5 26.0 - 34.0 pg   MCHC 31.9 30.0 - 36.0 g/dL   RDW 19.1 47.8 - 29.5 %   Platelets 187 150 - 400 K/uL   nRBC 0.0 0.0 - 0.2 %    Comment: Performed at [facility], [address] [place], [place] [address]  Urinalysis, Routine w reflex microscopic -Urine, Clean Catch     Status: Abnormal   Collection Time: 06/28/22  1:51 AM  Result Value Ref Range   Color, Urine YELLOW YELLOW   APPearance HAZY (A) CLEAR   Specific Gravity, Urine 1.006 1.005 - 1.030   pH 5.0 5.0 - 8.0   Glucose, UA NEGATIVE NEGATIVE mg/dL   Hgb urine dipstick SMALL (A) NEGATIVE    Bilirubin Urine NEGATIVE NEGATIVE   Ketones, ur NEGATIVE NEGATIVE mg/dL   Protein, ur NEGATIVE NEGATIVE mg/dL   Nitrite NEGATIVE NEGATIVE   Leukocytes,Ua NEGATIVE NEGATIVE   RBC / HPF 0-5 0 - 5 RBC/hpf   WBC, UA 0-5 0 - 5 WBC/hpf   Bacteria, UA MANY (A) NONE SEEN   Squamous Epithelial / HPF 6-10 0 - 5 /HPF   Mucus PRESENT    Hyaline Casts, UA PRESENT     Comment: Performed at [facility], [address], [place], [place] [address]   CT ABDOMEN PELVIS WO CONTRAST  Result Date: 06/28/2022 CLINICAL DATA:  Abdominal pain, acute, nonlocalized EXAM: CT ABDOMEN AND PELVIS WITHOUT CONTRAST TECHNIQUE: Multidetector CT imaging of the abdomen and pelvis was performed following the standard protocol without IV contrast. RADIATION DOSE REDUCTION: This exam was performed according to the departmental dose-optimization program which includes automated exposure control, adjustment of the mA and/or kV according to patient size and/or use of iterative reconstruction technique. COMPARISON:  None Available. FINDINGS: Lower chest: No acute abnormality. Hepatobiliary: No focal liver abnormality is seen. No gallstones, gallbladder wall thickening, or biliary dilatation. Pancreas: Unremarkable Spleen: Unremarkable Adrenals/Urinary Tract: Adrenal glands are unremarkable. Kidneys are normal, without renal calculi, focal lesion, or hydronephrosis. Bladder is unremarkable. Stomach/Bowel: Mild sigmoid diverticulosis. Stomach, small bowel, and large bowel are otherwise unremarkable. Appendix normal. No free intraperitoneal gas or fluid. Vascular/Lymphatic: Moderate aortoiliac atherosclerotic calcification. No aortic aneurysm. No pathologic adenopathy within the abdomen and pelvis. Reproductive: Involuted calcified uterine fibroid noted. The pelvic organs are otherwise unremarkable. Other: No abdominal wall hernia or abnormality. No abdominopelvic ascites. Musculoskeletal: Bilateral L5 pars defects are present with grade 1  anterolisthesis L5-S1. No acute bone abnormality. No lytic or blastic bone lesion. IMPRESSION: 1. No acute intra-abdominal pathology identified. No definite radiographic explanation for the patient's reported symptoms. 2. Mild sigmoid diverticulosis without superimposed acute inflammatory change. 3. Bilateral L5 pars defects with grade 1 anterolisthesis L5-S1. Aortic Atherosclerosis (ICD10-I70.0). Electronically Signed   By: Helyn Numbers M.D.   On: 06/28/2022 03:52   CT Head Wo Contrast  Result Date: 06/28/2022 CLINICAL DATA:  Headache to the left side of face EXAM: CT HEAD WITHOUT CONTRAST TECHNIQUE: Contiguous axial images were obtained from the base of the skull through the vertex without intravenous contrast. RADIATION DOSE REDUCTION: This exam was performed  according to the departmental dose-optimization program which includes automated exposure control, adjustment of the mA and/or kV according to patient size and/or use of iterative reconstruction technique. COMPARISON:  None Available. FINDINGS: Brain: No intracranial hemorrhage, mass effect, or evidence of acute infarct. No hydrocephalus. No extra-axial fluid collection. Ill-defined hypoattenuation within the cerebral white matter is nonspecific but consistent with chronic small vessel ischemic disease. Chronic left cerebellar infarct Vascular: No hyperdense vessel. Intracranial arterial calcification. Skull: No fracture or focal lesion. Sinuses/Orbits: No acute finding. Paranasal sinuses and mastoid air cells are well aerated. Other: None. IMPRESSION: 1. No evidence of acute intracranial abnormality. 2. Chronic small vessel ischemic disease. Chronic left cerebellar infarct. Electronically Signed   By: Minerva Fester M.D.   On: 06/28/2022 03:48    Pending Labs Unresulted Labs (From admission, onward)     Start     Ordered   07/05/22 0500  Creatinine, serum  (enoxaparin (LOVENOX)    CrCl < 30 ml/min)  Once,   R       Comments: while on  enoxaparin therapy.    06/28/22 0454   06/28/22 0454  HIV Antibody (routine testing w rflx)  (HIV Antibody (Routine testing w reflex) panel)  Once,   R        06/28/22 0454   06/28/22 0454  Creatinine, serum  (enoxaparin (LOVENOX)    CrCl < 30 ml/min)  Once,   R       Comments: Baseline for enoxaparin therapy IF NOT ALREADY DRAWN.    06/28/22 0454            Vitals/Pain Today's Vitals   06/28/22 0355 06/28/22 0356 06/28/22 0357 06/28/22 0400  BP:    117/65  Pulse: 78 75 76 72  Resp: 13 16 15 13   Temp:      SpO2: 97% 97% 98% 99%  Weight:      Height:      PainSc:        Isolation Precautions No active isolations  Medications Medications  enoxaparin (LOVENOX) injection 40 mg (has no administration in time range)  sodium chloride 0.9 % bolus 1,000 mL (0 mLs Intravenous Stopped 06/28/22 0500)    Mobility walks     Focused Assessments    R Recommendations: See Admitting Provider Note  Report given to:   Additional Notes: Call or epic message for any additional questions

## 2022-06-29 DIAGNOSIS — N179 Acute kidney failure, unspecified: Secondary | ICD-10-CM | POA: Diagnosis not present

## 2022-06-29 LAB — BASIC METABOLIC PANEL
Anion gap: 13 (ref 5–15)
BUN: 21 mg/dL — ABNORMAL HIGH (ref 6–20)
CO2: 25 mmol/L (ref 22–32)
Calcium: 8.9 mg/dL (ref 8.9–10.3)
Chloride: 100 mmol/L (ref 98–111)
Creatinine, Ser: 1.26 mg/dL — ABNORMAL HIGH (ref 0.44–1.00)
GFR, Estimated: 49 mL/min — ABNORMAL LOW (ref >60)
Glucose, Bld: 101 mg/dL — ABNORMAL HIGH (ref 70–99)
Potassium: 3 mmol/L — ABNORMAL LOW (ref 3.5–5.1)
Sodium: 138 mmol/L (ref 135–145)

## 2022-06-29 LAB — CBC
HCT: 33.8 % — ABNORMAL LOW (ref 36.0–46.0)
Hemoglobin: 10.8 g/dL — ABNORMAL LOW (ref 12.0–15.0)
MCH: 26.7 pg (ref 26.0–34.0)
MCHC: 32 g/dL (ref 30.0–36.0)
MCV: 83.7 fL (ref 80.0–100.0)
Platelets: 196 10*3/uL (ref 150–400)
RBC: 4.04 MIL/uL (ref 3.87–5.11)
RDW: 13.2 % (ref 11.5–15.5)
WBC: 5.7 10*3/uL (ref 4.0–10.5)
nRBC: 0 % (ref 0.0–0.2)

## 2022-06-29 LAB — MAGNESIUM: Magnesium: 1.8 mg/dL (ref 1.7–2.4)

## 2022-06-29 LAB — HIV ANTIBODY (ROUTINE TESTING W REFLEX): HIV Screen 4th Generation wRfx: NONREACTIVE

## 2022-06-29 MED ORDER — POTASSIUM CHLORIDE CRYS ER 20 MEQ PO TBCR
60.0000 meq | EXTENDED_RELEASE_TABLET | Freq: Once | ORAL | Status: AC
  Administered 2022-06-29: 60 meq via ORAL
  Filled 2022-06-29: qty 3

## 2022-06-29 MED ORDER — POTASSIUM CHLORIDE CRYS ER 20 MEQ PO TBCR: 40.0000 meq | EXTENDED_RELEASE_TABLET | Freq: Every day | ORAL | 0 refills | Status: AC

## 2022-06-29 MED ORDER — POLYETHYLENE GLYCOL 3350 17 G PO PACK: 17.0000 g | PACK | Freq: Every day | ORAL | 0 refills | Status: AC | PRN

## 2022-06-29 NOTE — Discharge Summary (Signed)
Physician Discharge Summary  Terri Dickerson AOZ:[id number] DOB: Feb 09, 1960 DOA: 06/28/2022  PCP: Ollen Bowl, MD  Admit date: 06/28/2022 Discharge date: 06/29/2022  Admitted From: Home Disposition:  Home  Discharge Condition:Stable CODE STATUS:FULL Diet recommendation: Heart Healthy  Brief/Interim Summary: Patient is a 62 y.o. female with medical history significant for prior CVA in 2018, hypertension, hyperlipidemia, CKD 3B, who presented to [facility] ED from home due to complaints of intermittent dizziness and intermittent abdominal bloating and cramping for the past few weeks.  She was complaining of  no bowel movement in 2 to 3 days.  No nausea or vomiting.  Lab work showed creatinine in the range of 2, baseline creatinine around 1.3.  Patient was started on IV fluids.  However abdominal discomfort has resolved.  She had bowel movements.  Kidney function has returned to baseline.  She is medically stable for discharge home today.  Following problems were addressed during the hospitalization: AKI on CKD 3B, suspect prerenal secondary to dehydration from poor intake and home diuretics Baseline creatinine appears to be 1.3 with GFR 46 Presented with creatinine of 2.24 with GFR of 25 Started on iv fluids.  Kidney function has returned to baseline. Ultrasound of the kidneys showed mild diffuse increased echogenicity in the kidneys bilaterally, suggesting underlying medical renal disease. Hold off home losartan/HCTZ for now Restart home Coreg and amlodipine on dc   Abdominal cramping, constipation Resolved.  Having bowel movements.  Abdomen soft, nondistended and nontender today   Headache CT head nonacute Resolved   hypertension The patient is on 4 oral antihypertensives including a diuretic, HCTZ Stopped hydrochlorothiazide, losartan  Hyperlipidemia Resume home Lipitor   History of CVA in 2018 Minimal residual from her CVA, previously with left-sided weakness. Resume home  aspirin and atorvastatin.   History of prediabetes With last hemoglobin A1c 5.8 in 2018. Monitor blood sugars at home Discharge Diagnoses:  Principal Problem:   AKI (acute kidney injury) [facility])    Discharge Instructions  Discharge Instructions     Diet - low sodium heart healthy   Complete by: As directed    Discharge instructions   Complete by: As directed    1)Please take your medications as instructed 2)Follow up with your PCP in  a week 3)We have discontinued some of your blood pressure medications.  Monitor blood pressure at home   Increase activity slowly   Complete by: As directed       Allergies as of 06/29/2022   No Known Allergies      Medication List     STOP taking these medications    hydrochlorothiazide 25 MG tablet Commonly known as: HYDRODIURIL   ibuprofen 800 MG tablet Commonly known as: ADVIL   lisinopril-hydrochlorothiazide 20-25 MG tablet Commonly known as: ZESTORETIC   losartan 100 MG tablet Commonly known as: COZAAR       TAKE these medications    amLODipine 10 MG tablet Commonly known as: NORVASC Take 1 tablet (10 mg total) by mouth daily.   aspirin 81 MG chewable tablet CHEW ONE TABLET BY MOUTH DAILY What changed: See the new instructions.   atorvastatin 20 MG tablet Commonly known as: LIPITOR Take 20 mg by mouth at bedtime. What changed: Another medication with the same name was removed. Continue taking this medication, and follow the directions you see here.   carvedilol 12.5 MG tablet Commonly known as: COREG Take 1 tablet (12.5 mg total) by mouth 2 (two) times daily with a meal.   polyethylene glycol 17  g packet Commonly known as: MiraLax Take 17 g by mouth daily as needed.   potassium chloride SA 20 MEQ tablet Commonly known as: KLOR-CON M Take 2 tablets (40 mEq total) by mouth daily for 3 doses. Start taking on: June 30, 2022   terbinafine 250 MG tablet Commonly known as: LamISIL Take 1 tablet (250 mg total)  by mouth daily.        Follow-up Information     Pahwani, Kasandra Knudsen, MD. Schedule an appointment as soon as possible for a visit in 1 week(s).   Specialty: Internal Medicine Contact information: [address] [place] [address] [phone]                No Known Allergies  Consultations:    Procedures/Studies: US RENAL  Result Date: 06/28/2022 CLINICAL DATA:  62 year old female with history of acute kidney injury. EXAM: RENAL / URINARY TRACT ULTRASOUND COMPLETE COMPARISON:  No priors. FINDINGS: Right Kidney: Renal measurements: 10.8 x 6.4 x 5.8 cm = volume: 211.4 mL. Diffusely increased echogenicity of the renal parenchyma. No mass or hydronephrosis visualized. Left Kidney: Renal measurements: 12.1 x 6.1 x 5.8 cm = volume: 223 mL. Diffusely increased echogenicity of the renal parenchyma. No mass or hydronephrosis visualized. Bladder: Appears normal for degree of bladder distention. Other: None. IMPRESSION: 1. Mild diffuse increased echogenicity in the kidneys bilaterally, suggesting underlying medical renal disease. 2. No hydronephrosis or other acute findings. Electronically Signed   By: Trudie Reed M.D.   On: 06/28/2022 05:57   CT ABDOMEN PELVIS WO CONTRAST  Result Date: 06/28/2022 CLINICAL DATA:  Abdominal pain, acute, nonlocalized EXAM: CT ABDOMEN AND PELVIS WITHOUT CONTRAST TECHNIQUE: Multidetector CT imaging of the abdomen and pelvis was performed following the standard protocol without IV contrast. RADIATION DOSE REDUCTION: This exam was performed according to the departmental dose-optimization program which includes automated exposure control, adjustment of the mA and/or kV according to patient size and/or use of iterative reconstruction technique. COMPARISON:  None Available. FINDINGS: Lower chest: No acute abnormality. Hepatobiliary: No focal liver abnormality is seen. No gallstones, gallbladder wall thickening, or biliary dilatation. Pancreas:  Unremarkable Spleen: Unremarkable Adrenals/Urinary Tract: Adrenal glands are unremarkable. Kidneys are normal, without renal calculi, focal lesion, or hydronephrosis. Bladder is unremarkable. Stomach/Bowel: Mild sigmoid diverticulosis. Stomach, small bowel, and large bowel are otherwise unremarkable. Appendix normal. No free intraperitoneal gas or fluid. Vascular/Lymphatic: Moderate aortoiliac atherosclerotic calcification. No aortic aneurysm. No pathologic adenopathy within the abdomen and pelvis. Reproductive: Involuted calcified uterine fibroid noted. The pelvic organs are otherwise unremarkable. Other: No abdominal wall hernia or abnormality. No abdominopelvic ascites. Musculoskeletal: Bilateral L5 pars defects are present with grade 1 anterolisthesis L5-S1. No acute bone abnormality. No lytic or blastic bone lesion. IMPRESSION: 1. No acute intra-abdominal pathology identified. No definite radiographic explanation for the patient's reported symptoms. 2. Mild sigmoid diverticulosis without superimposed acute inflammatory change. 3. Bilateral L5 pars defects with grade 1 anterolisthesis L5-S1. Aortic Atherosclerosis (ICD10-I70.0). Electronically Signed   By: Helyn Numbers M.D.   On: 06/28/2022 03:52   CT Head Wo Contrast  Result Date: 06/28/2022 CLINICAL DATA:  Headache to the left side of face EXAM: CT HEAD WITHOUT CONTRAST TECHNIQUE: Contiguous axial images were obtained from the base of the skull through the vertex without intravenous contrast. RADIATION DOSE REDUCTION: This exam was performed according to the departmental dose-optimization program which includes automated exposure control, adjustment of the mA and/or kV according to patient size and/or use of iterative reconstruction technique. COMPARISON:  None Available. FINDINGS: Brain: No intracranial hemorrhage, mass effect, or evidence of acute infarct. No hydrocephalus. No extra-axial fluid collection. Ill-defined hypoattenuation within the  cerebral white matter is nonspecific but consistent with chronic small vessel ischemic disease. Chronic left cerebellar infarct Vascular: No hyperdense vessel. Intracranial arterial calcification. Skull: No fracture or focal lesion. Sinuses/Orbits: No acute finding. Paranasal sinuses and mastoid air cells are well aerated. Other: None. IMPRESSION: 1. No evidence of acute intracranial abnormality. 2. Chronic small vessel ischemic disease. Chronic left cerebellar infarct. Electronically Signed   By: Minerva Fester M.D.   On: 06/28/2022 03:48      Subjective: Patient seen and examined at bedside today.  Hemodynamically stable.  Comfortable.  Lying in bed.  Denies any abdomen pain, nausea or vomiting today.  Medically stable for discharge.  Discharge Exam: Vitals:   06/29/22 0513 06/29/22 0826  BP: 122/77 114/61  Pulse: 72 81  Resp: 16   Temp: 98.1 F (36.7 C)   SpO2: 99% 98%   Vitals:   06/28/22 1820 06/28/22 2041 06/29/22 0513 06/29/22 0826  BP: 100/62 111/66 122/77 114/61  Pulse: 75 67 72 81  Resp: 18 16 16    Temp: 98.4 F (36.9 C) 98.1 F (36.7 C) 98.1 F (36.7 C)   SpO2: 97% 98% 99% 98%  Weight:      Height:        General: Pt is alert, awake, not in acute distress, morbidly obese Cardiovascular: RRR, S1/S2 +, no rubs, no gallops Respiratory: CTA bilaterally, no wheezing, no rhonchi Abdominal: Soft, NT, ND, bowel sounds + Extremities: no edema, no cyanosis    The results of significant diagnostics from this hospitalization (including imaging, microbiology, ancillary and laboratory) are listed below for reference.     Microbiology: No results found for this or any previous visit (from the past 240 hour(s)).   Labs: BNP (last 3 results) No results for input(s): "BNP" in the last 8760 hours. Basic Metabolic Panel: Recent Labs  Lab 06/28/22 0146 06/29/22 0648  NA 135 138  K 3.6 3.0*  CL 97* 100  CO2 26 25  GLUCOSE 121* 101*  BUN 30* 21*  CREATININE 2.24* 1.26*   CALCIUM 9.5 8.9  MG  --  1.8   Liver Function Tests: Recent Labs  Lab 06/28/22 0146  AST 14*  ALT 11  ALKPHOS 85  BILITOT 0.7  PROT 8.4*  ALBUMIN 3.7   Recent Labs  Lab 06/28/22 0146  LIPASE 29   No results for input(s): "AMMONIA" in the last 168 hours. CBC: Recent Labs  Lab 06/28/22 0146 06/29/22 0648  WBC 9.2 5.7  HGB 11.0* 10.8*  HCT 34.5* 33.8*  MCV 83.1 83.7  PLT 187 196   Cardiac Enzymes: No results for input(s): "CKTOTAL", "CKMB", "CKMBINDEX", "TROPONINI" in the last 168 hours. BNP: Invalid input(s): "POCBNP" CBG: No results for input(s): "GLUCAP" in the last 168 hours. D-Dimer No results for input(s): "DDIMER" in the last 72 hours. Hgb A1c No results for input(s): "HGBA1C" in the last 72 hours. Lipid Profile No results for input(s): "CHOL", "HDL", "LDLCALC", "TRIG", "CHOLHDL", "LDLDIRECT" in the last 72 hours. Thyroid function studies No results for input(s): "TSH", "T4TOTAL", "T3FREE", "THYROIDAB" in the last 72 hours.  Invalid input(s): "FREET3" Anemia work up No results for input(s): "VITAMINB12", "FOLATE", "FERRITIN", "TIBC", "IRON", "RETICCTPCT" in the last 72 hours. Urinalysis    Component Value Date/Time   COLORURINE YELLOW 06/28/2022 0151   APPEARANCEUR HAZY (A) 06/28/2022 0151   LABSPEC 1.006 06/28/2022  0151   PHURINE 5.0 06/28/2022 0151   GLUCOSEU NEGATIVE 06/28/2022 0151   HGBUR SMALL (A) 06/28/2022 0151   BILIRUBINUR NEGATIVE 06/28/2022 0151   KETONESUR NEGATIVE 06/28/2022 0151   PROTEINUR NEGATIVE 06/28/2022 0151   NITRITE NEGATIVE 06/28/2022 0151   LEUKOCYTESUR NEGATIVE 06/28/2022 0151   Sepsis Labs Recent Labs  Lab 06/28/22 0146 06/29/22 0648  WBC 9.2 5.7   Microbiology No results found for this or any previous visit (from the past 240 hour(s)).  Please note: You were cared for by a hospitalist during your hospital stay. Once you are discharged, your primary care physician will handle any further medical issues.  Please note that NO REFILLS for any discharge medications will be authorized once you are discharged, as it is imperative that you return to your primary care physician (or establish a relationship with a primary care physician if you do not have one) for your post hospital discharge needs so that they can reassess your need for medications and monitor your lab values.    Time coordinating discharge: 40 minutes  SIGNED:   Burnadette Pop, MD  [facility] 06/29/2022, 11:27 AM Pager [phone]  If 7PM-7AM, please contact night-coverage [email or web address] Password [email or web address]

## 2022-06-29 NOTE — Progress Notes (Signed)
Transition of Care [facility]) - Inpatient Brief Assessment   Patient Details  Name: Terri Dickerson MRN: [id number] Date of Birth: [birth year]  Transition of Care [facility]) CM/SW Contact:    Janae Bridgeman, RN Phone Number: 06/29/2022, 11:31 AM   Clinical Narrative: Patient is being discharged home today by bedside nursing.  No TOC needs at this time.   Transition of Care Asessment: Insurance and Status: (P) Insurance coverage has been reviewed Patient has primary care physician: (P) Yes Home environment has been reviewed: (P) Yes Prior level of function:: (P) Independent Prior/Current Home Services: (P) No current home services Social Determinants of Health Reivew: (P) SDOH reviewed no interventions necessary Readmission risk has been reviewed: (P) Yes Transition of care needs: (P) no transition of care needs at this time

## 2022-06-29 NOTE — Plan of Care (Signed)

## 2023-04-27 ENCOUNTER — Ambulatory Visit (INDEPENDENT_AMBULATORY_CARE_PROVIDER_SITE_OTHER)

## 2023-04-27 ENCOUNTER — Encounter: Payer: Self-pay | Admitting: Podiatry

## 2023-04-27 ENCOUNTER — Ambulatory Visit: Admitting: Podiatry

## 2023-04-27 DIAGNOSIS — L600 Ingrowing nail: Secondary | ICD-10-CM | POA: Diagnosis not present

## 2023-04-27 DIAGNOSIS — M722 Plantar fascial fibromatosis: Secondary | ICD-10-CM | POA: Diagnosis not present

## 2023-04-27 DIAGNOSIS — S99922A Unspecified injury of left foot, initial encounter: Secondary | ICD-10-CM | POA: Diagnosis not present

## 2023-04-27 MED ORDER — TRIAMCINOLONE ACETONIDE 10 MG/ML IJ SUSP
10.0000 mg | Freq: Once | INTRAMUSCULAR | Status: AC
Start: 2023-04-27 — End: 2023-04-27
  Administered 2023-04-27: 10 mg via INTRA_ARTICULAR

## 2023-04-27 NOTE — Patient Instructions (Signed)

## 2023-04-28 NOTE — Progress Notes (Signed)
 Subjective:   Patient ID: Terri Dickerson, female   DOB: 63 y.o.   MRN: [id number]   HPI Patient presents with 2 separate times that are hurting in her foot.  States that the left heel has been bothersome and she is not sure if she may have stepped on something and she has got a lot of discomfort in the left big toenail with thickness with history of having several of the right nails removed it did very well.  Good health   ROS      Objective:  Physical Exam  Neurovascular status intact with discomfort in the left hallux nail bed thickness and pain with pressure with discomfort in the left plantar heel no indication portal of entry or indication that there is a traumatic event that occurred     Assessment:  Damaged left hallux nail bed thick with pain and inflammatory fasciitis left with fluid buildup with the possibility for glass     Plan:  H&P x-ray left reviewed discussed both conditions sterile prep injected the left plantar fascia insertion 3 mg Kenalog 5 mg Xylocaine applied sterile dressing and the nail I went ahead recommended removal explained procedure risk patient wants this done signed consent form understanding its permanent and I infiltrated 60 mg like Marcaine mixture sterile prep done using sterile instrumentation removed the hallux nail exposed matrix applied phenol 5 applications 30 seconds followed by alcohol lavage sterile dressing and instructed on soaks.  Reappoint for [place] to recheck  X-rays indicate no signs of glass left plantar heel small spur formation
